# Patient Record
Sex: Male | Born: 1980 | Race: Black or African American | Hispanic: No | Smoking: Never smoker
Health system: Southern US, Community
[De-identification: ages and names within clinical notes are randomized; demographics above are authoritative.]

## PROBLEM LIST (undated history)

## (undated) DIAGNOSIS — I213 ST elevation (STEMI) myocardial infarction of unspecified site: Secondary | ICD-10-CM

## (undated) DIAGNOSIS — I10 Essential (primary) hypertension: Secondary | ICD-10-CM

## (undated) DIAGNOSIS — E785 Hyperlipidemia, unspecified: Secondary | ICD-10-CM

## (undated) DIAGNOSIS — R7303 Prediabetes: Secondary | ICD-10-CM

## (undated) DIAGNOSIS — E782 Mixed hyperlipidemia: Secondary | ICD-10-CM

## (undated) DIAGNOSIS — I2510 Atherosclerotic heart disease of native coronary artery without angina pectoris: Principal | ICD-10-CM

---

## 2019-12-01 ENCOUNTER — Encounter (HOSPITAL_COMMUNITY)
Admission: EM | Disposition: A | Discharge status: Home / Self Care | Payer: Self-pay | Source: Home / Self Care | Attending: Interventional Cardiology

## 2019-12-01 ENCOUNTER — Other Ambulatory Visit: Payer: Self-pay

## 2019-12-01 ENCOUNTER — Emergency Department (HOSPITAL_BASED_OUTPATIENT_CLINIC_OR_DEPARTMENT_OTHER): Payer: Self-pay

## 2019-12-01 ENCOUNTER — Encounter (HOSPITAL_BASED_OUTPATIENT_CLINIC_OR_DEPARTMENT_OTHER): Payer: Self-pay | Admitting: *Deleted

## 2019-12-01 ENCOUNTER — Inpatient Hospital Stay (HOSPITAL_BASED_OUTPATIENT_CLINIC_OR_DEPARTMENT_OTHER)
Admission: EM | Admit: 2019-12-01 | Discharge: 2019-12-04 | DRG: 246 | Disposition: A | Discharge status: Home / Self Care | Payer: Self-pay | Attending: Interventional Cardiology | Admitting: Interventional Cardiology

## 2019-12-01 DIAGNOSIS — Z20822 Contact with and (suspected) exposure to covid-19: Secondary | ICD-10-CM | POA: Diagnosis present

## 2019-12-01 DIAGNOSIS — E785 Hyperlipidemia, unspecified: Secondary | ICD-10-CM

## 2019-12-01 DIAGNOSIS — E669 Obesity, unspecified: Secondary | ICD-10-CM | POA: Diagnosis present

## 2019-12-01 DIAGNOSIS — Z6830 Body mass index (BMI) 30.0-30.9, adult: Secondary | ICD-10-CM

## 2019-12-01 DIAGNOSIS — Z955 Presence of coronary angioplasty implant and graft: Secondary | ICD-10-CM

## 2019-12-01 DIAGNOSIS — I110 Hypertensive heart disease with heart failure: Secondary | ICD-10-CM | POA: Diagnosis present

## 2019-12-01 DIAGNOSIS — I2111 ST elevation (STEMI) myocardial infarction involving right coronary artery: Principal | ICD-10-CM | POA: Diagnosis present

## 2019-12-01 DIAGNOSIS — I10 Essential (primary) hypertension: Secondary | ICD-10-CM

## 2019-12-01 DIAGNOSIS — I5043 Acute on chronic combined systolic (congestive) and diastolic (congestive) heart failure: Secondary | ICD-10-CM | POA: Diagnosis present

## 2019-12-01 DIAGNOSIS — I219 Acute myocardial infarction, unspecified: Secondary | ICD-10-CM | POA: Diagnosis present

## 2019-12-01 DIAGNOSIS — I213 ST elevation (STEMI) myocardial infarction of unspecified site: Secondary | ICD-10-CM

## 2019-12-01 DIAGNOSIS — E119 Type 2 diabetes mellitus without complications: Secondary | ICD-10-CM | POA: Diagnosis present

## 2019-12-01 DIAGNOSIS — I2510 Atherosclerotic heart disease of native coronary artery without angina pectoris: Secondary | ICD-10-CM | POA: Diagnosis present

## 2019-12-01 HISTORY — DX: Hyperlipidemia, unspecified: E78.5

## 2019-12-01 HISTORY — PX: LEFT HEART CATH AND CORONARY ANGIOGRAPHY: CATH118249

## 2019-12-01 HISTORY — PX: CORONARY/GRAFT ACUTE MI REVASCULARIZATION: CATH118305

## 2019-12-01 HISTORY — DX: ST elevation (STEMI) myocardial infarction of unspecified site: I21.3

## 2019-12-01 HISTORY — PX: CORONARY BALLOON ANGIOPLASTY: CATH118233

## 2019-12-01 HISTORY — DX: Prediabetes: R73.03

## 2019-12-01 HISTORY — DX: Essential (primary) hypertension: I10

## 2019-12-01 LAB — CBC WITH DIFFERENTIAL/PLATELET
Abs Immature Granulocytes: 0.02 10*3/uL (ref 0.00–0.07)
Basophils Absolute: 0 10*3/uL (ref 0.0–0.1)
Basophils Relative: 0 %
Eosinophils Absolute: 0 10*3/uL (ref 0.0–0.5)
Eosinophils Relative: 0 %
HCT: 45 % (ref 39.0–52.0)
Hemoglobin: 15.5 g/dL (ref 13.0–17.0)
Immature Granulocytes: 0 %
Lymphocytes Relative: 19 %
Lymphs Abs: 1.5 10*3/uL (ref 0.7–4.0)
MCH: 30.8 pg (ref 26.0–34.0)
MCHC: 34.4 g/dL (ref 30.0–36.0)
MCV: 89.5 fL (ref 80.0–100.0)
Monocytes Absolute: 0.5 10*3/uL (ref 0.1–1.0)
Monocytes Relative: 6 %
Neutro Abs: 5.6 10*3/uL (ref 1.7–7.7)
Neutrophils Relative %: 75 %
Platelets: 323 10*3/uL (ref 150–400)
RBC: 5.03 MIL/uL (ref 4.22–5.81)
RDW: 13.2 % (ref 11.5–15.5)
WBC: 7.6 10*3/uL (ref 4.0–10.5)
nRBC: 0 % (ref 0.0–0.2)

## 2019-12-01 LAB — LIPASE, BLOOD: Lipase: 24 U/L (ref 11–51)

## 2019-12-01 LAB — COMPREHENSIVE METABOLIC PANEL
ALT: 46 U/L — ABNORMAL HIGH (ref 0–44)
AST: 68 U/L — ABNORMAL HIGH (ref 15–41)
Albumin: 4.6 g/dL (ref 3.5–5.0)
Alkaline Phosphatase: 49 U/L (ref 38–126)
Anion gap: 11 (ref 5–15)
BUN: 12 mg/dL (ref 6–20)
CO2: 25 mmol/L (ref 22–32)
Calcium: 9.7 mg/dL (ref 8.9–10.3)
Chloride: 99 mmol/L (ref 98–111)
Creatinine, Ser: 1.18 mg/dL (ref 0.61–1.24)
GFR, Estimated: >60 mL/min (ref >60)
Glucose, Bld: 141 mg/dL — ABNORMAL HIGH (ref 70–99)
Potassium: 4 mmol/L (ref 3.5–5.1)
Sodium: 135 mmol/L (ref 135–145)
Total Bilirubin: 0.5 mg/dL (ref 0.3–1.2)
Total Protein: 8.3 g/dL — ABNORMAL HIGH (ref 6.5–8.1)

## 2019-12-01 LAB — RESPIRATORY PANEL BY RT PCR (FLU A&B, COVID)
Influenza A by PCR: NEGATIVE
Influenza B by PCR: NEGATIVE
SARS Coronavirus 2 by RT PCR: NEGATIVE

## 2019-12-01 LAB — TROPONIN I (HIGH SENSITIVITY): Troponin I (High Sensitivity): 2168 ng/L (ref <18)

## 2019-12-01 LAB — D-DIMER, QUANTITATIVE: D-Dimer, Quant: 0.79 ug/mL-FEU — ABNORMAL HIGH (ref 0.00–0.50)

## 2019-12-01 SURGERY — CORONARY/GRAFT ACUTE MI REVASCULARIZATION
Anesthesia: LOCAL

## 2019-12-01 MED ORDER — LIDOCAINE HCL (PF) 1 % IJ SOLN
INTRAMUSCULAR | Status: AC
  Filled 2019-12-01: qty 30

## 2019-12-01 MED ORDER — ONDANSETRON HCL 4 MG/2ML IJ SOLN: 4.0000 mg | Freq: Four times a day (QID) | INTRAMUSCULAR | Status: DC | PRN

## 2019-12-01 MED ORDER — VERAPAMIL HCL 2.5 MG/ML IV SOLN
INTRAVENOUS | Status: AC
  Filled 2019-12-01: qty 2

## 2019-12-01 MED ORDER — NITROGLYCERIN IN D5W 200-5 MCG/ML-% IV SOLN
INTRAVENOUS | Status: AC | PRN
  Administered 2019-12-01: 20 ug/min via INTRAVENOUS

## 2019-12-01 MED ORDER — AMLODIPINE BESYLATE 5 MG PO TABS
5.0000 mg | ORAL_TABLET | Freq: Every day | ORAL | Status: DC
  Administered 2019-12-01 – 2019-12-04 (×3): 5 mg via ORAL
  Filled 2019-12-01 (×4): qty 1

## 2019-12-01 MED ORDER — TIROFIBAN HCL IN NACL 5-0.9 MG/100ML-% IV SOLN
INTRAVENOUS | Status: AC
  Filled 2019-12-01: qty 100

## 2019-12-01 MED ORDER — HEPARIN (PORCINE) 25000 UT/250ML-% IV SOLN
1000.0000 [IU]/h | INTRAVENOUS | Status: DC
  Filled 2019-12-01: qty 250

## 2019-12-01 MED ORDER — TIROFIBAN HCL IN NACL 5-0.9 MG/100ML-% IV SOLN
0.1500 ug/kg/min | INTRAVENOUS | Status: DC
  Administered 2019-12-01 – 2019-12-03 (×7): 0.15 ug/kg/min via INTRAVENOUS
  Filled 2019-12-01: qty 100
  Filled 2019-12-01: qty 200
  Filled 2019-12-01 (×3): qty 100

## 2019-12-01 MED ORDER — IOHEXOL 350 MG/ML SOLN
INTRAVENOUS | Status: AC
  Filled 2019-12-01: qty 1

## 2019-12-01 MED ORDER — IOHEXOL 350 MG/ML SOLN
INTRAVENOUS | Status: DC | PRN
  Administered 2019-12-01: 180 mL via INTRA_ARTERIAL

## 2019-12-01 MED ORDER — CLOPIDOGREL BISULFATE 75 MG PO TABS
75.0000 mg | ORAL_TABLET | Freq: Every day | ORAL | Status: DC
  Filled 2019-12-01: qty 1

## 2019-12-01 MED ORDER — FENTANYL CITRATE (PF) 100 MCG/2ML IJ SOLN: 12.5000 ug | INTRAMUSCULAR | Status: DC | PRN

## 2019-12-01 MED ORDER — FENTANYL CITRATE (PF) 100 MCG/2ML IJ SOLN: 50.0000 ug | Freq: Once | INTRAMUSCULAR | Status: AC

## 2019-12-01 MED ORDER — TIROFIBAN (AGGRASTAT) BOLUS VIA INFUSION
INTRAVENOUS | Status: DC | PRN
  Administered 2019-12-01: 2682.5 ug via INTRAVENOUS

## 2019-12-01 MED ORDER — HEPARIN (PORCINE) IN NACL 1000-0.9 UT/500ML-% IV SOLN
INTRAVENOUS | Status: AC
  Filled 2019-12-01: qty 500

## 2019-12-01 MED ORDER — CLOPIDOGREL BISULFATE 300 MG PO TABS
ORAL_TABLET | ORAL | Status: DC | PRN
  Administered 2019-12-01: 600 mg via ORAL

## 2019-12-01 MED ORDER — CLOPIDOGREL BISULFATE 300 MG PO TABS
ORAL_TABLET | ORAL | Status: AC
  Filled 2019-12-01: qty 1

## 2019-12-01 MED ORDER — LIDOCAINE VISCOUS HCL 2 % MT SOLN
15.0000 mL | Freq: Once | OROMUCOSAL | Status: AC
  Administered 2019-12-01: 15 mL via ORAL
  Filled 2019-12-01: qty 15

## 2019-12-01 MED ORDER — ALUM & MAG HYDROXIDE-SIMETH 200-200-20 MG/5ML PO SUSP
30.0000 mL | Freq: Once | ORAL | Status: AC
  Administered 2019-12-01: 30 mL via ORAL
  Filled 2019-12-01: qty 30

## 2019-12-01 MED ORDER — HEPARIN (PORCINE) IN NACL 1000-0.9 UT/500ML-% IV SOLN
INTRAVENOUS | Status: DC | PRN
  Administered 2019-12-01 (×2): 500 mL

## 2019-12-01 MED ORDER — MIDAZOLAM HCL 2 MG/2ML IJ SOLN
INTRAMUSCULAR | Status: AC
  Filled 2019-12-01: qty 2

## 2019-12-01 MED ORDER — ACETAMINOPHEN 325 MG PO TABS: 650.0000 mg | ORAL_TABLET | ORAL | Status: DC | PRN

## 2019-12-01 MED ORDER — FENTANYL CITRATE (PF) 100 MCG/2ML IJ SOLN
INTRAMUSCULAR | Status: DC | PRN
  Administered 2019-12-01: 25 ug via INTRAVENOUS

## 2019-12-01 MED ORDER — SODIUM CHLORIDE 0.9 % IV SOLN
INTRAVENOUS | Status: AC | PRN
  Administered 2019-12-01: 100 mL/h via INTRAVENOUS

## 2019-12-01 MED ORDER — FENTANYL CITRATE (PF) 100 MCG/2ML IJ SOLN
INTRAMUSCULAR | Status: AC
  Administered 2019-12-01: 50 ug via INTRAVENOUS
  Filled 2019-12-01: qty 2

## 2019-12-01 MED ORDER — MIDAZOLAM HCL 2 MG/2ML IJ SOLN
INTRAMUSCULAR | Status: DC | PRN
  Administered 2019-12-01: 1 mg via INTRAVENOUS

## 2019-12-01 MED ORDER — HEPARIN SODIUM (PORCINE) 1000 UNIT/ML IJ SOLN
INTRAMUSCULAR | Status: DC | PRN
  Administered 2019-12-01: 5000 [IU] via INTRAVENOUS
  Administered 2019-12-01: 3000 [IU] via INTRAVENOUS

## 2019-12-01 MED ORDER — FENTANYL CITRATE (PF) 100 MCG/2ML IJ SOLN
INTRAMUSCULAR | Status: AC
  Filled 2019-12-01: qty 2

## 2019-12-01 MED ORDER — VERAPAMIL HCL 2.5 MG/ML IV SOLN
INTRAVENOUS | Status: DC | PRN
  Administered 2019-12-01: 10 mL via INTRA_ARTERIAL

## 2019-12-01 MED ORDER — HEPARIN BOLUS VIA INFUSION
4000.0000 [IU] | Freq: Once | INTRAVENOUS | Status: AC
  Administered 2019-12-01: 4000 [IU] via INTRAVENOUS

## 2019-12-01 MED ORDER — HEPARIN (PORCINE) 25000 UT/250ML-% IV SOLN
1500.0000 [IU]/h | INTRAVENOUS | Status: DC
  Administered 2019-12-02: 1000 [IU]/h via INTRAVENOUS
  Administered 2019-12-03: 1500 [IU]/h via INTRAVENOUS
  Filled 2019-12-01 (×2): qty 250

## 2019-12-01 MED ORDER — TIROFIBAN HCL IN NACL 5-0.9 MG/100ML-% IV SOLN
INTRAVENOUS | Status: AC | PRN
  Administered 2019-12-01: 0.15 ug/kg/min via INTRAVENOUS

## 2019-12-01 MED ORDER — HEPARIN SODIUM (PORCINE) 1000 UNIT/ML IJ SOLN
INTRAMUSCULAR | Status: AC
  Filled 2019-12-01: qty 1

## 2019-12-01 MED ORDER — ASPIRIN EC 81 MG PO TBEC: 81.0000 mg | DELAYED_RELEASE_TABLET | Freq: Every day | ORAL | Status: DC

## 2019-12-01 MED ORDER — LIDOCAINE HCL (PF) 1 % IJ SOLN
INTRAMUSCULAR | Status: DC | PRN
  Administered 2019-12-01: 5 mL via SUBCUTANEOUS

## 2019-12-01 MED ORDER — ATORVASTATIN CALCIUM 80 MG PO TABS: 80.0000 mg | ORAL_TABLET | Freq: Every day | ORAL | Status: DC

## 2019-12-01 MED ORDER — ASPIRIN 81 MG PO CHEW
CHEWABLE_TABLET | ORAL | Status: AC
  Administered 2019-12-01: 324 mg
  Filled 2019-12-01: qty 4

## 2019-12-01 MED ORDER — NITROGLYCERIN 1 MG/10 ML FOR IR/CATH LAB
INTRA_ARTERIAL | Status: DC | PRN
  Administered 2019-12-01: 200 ug via INTRACORONARY

## 2019-12-01 MED ORDER — NITROGLYCERIN IN D5W 200-5 MCG/ML-% IV SOLN
INTRAVENOUS | Status: AC
  Filled 2019-12-01: qty 250

## 2019-12-01 MED ORDER — HEPARIN (PORCINE) 25000 UT/250ML-% IV SOLN
1250.0000 [IU]/h | INTRAVENOUS | Status: DC
  Administered 2019-12-01: 1250 [IU]/h via INTRAVENOUS
  Filled 2019-12-01: qty 250

## 2019-12-01 MED ORDER — NITROGLYCERIN 0.4 MG SL SUBL: 0.4000 mg | SUBLINGUAL_TABLET | SUBLINGUAL | Status: DC | PRN

## 2019-12-01 SURGICAL SUPPLY — 16 items
BALLN SAPPHIRE 2.0X12 (BALLOONS) ×2
BALLOON SAPPHIRE 2.0X12 (BALLOONS) ×1 IMPLANT
CATH 5FR JL3.5 JR4 ANG PIG MP (CATHETERS) ×2 IMPLANT
CATH LAUNCHER 5F EBU3.5 (CATHETERS) ×2 IMPLANT
CATH VISTA GUIDE 6FR JR4 (CATHETERS) ×2 IMPLANT
DEVICE RAD COMP TR BAND LRG (VASCULAR PRODUCTS) ×2 IMPLANT
ELECT DEFIB PAD ADLT CADENCE (PAD) ×2 IMPLANT
GLIDESHEATH SLEND A-KIT 6F 22G (SHEATH) ×2 IMPLANT
GUIDEWIRE INQWIRE 1.5J.035X260 (WIRE) ×1 IMPLANT
INQWIRE 1.5J .035X260CM (WIRE) ×2
KIT HEART LEFT (KITS) ×2 IMPLANT
PACK CARDIAC CATHETERIZATION (CUSTOM PROCEDURE TRAY) ×2 IMPLANT
TRANSDUCER W/STOPCOCK (MISCELLANEOUS) ×2 IMPLANT
TUBING CIL FLEX 10 FLL-RA (TUBING) ×2 IMPLANT
WIRE ASAHI PROWATER 180CM (WIRE) ×2 IMPLANT
WIRE COUGAR XT STRL 190CM (WIRE) ×2 IMPLANT

## 2019-12-01 NOTE — Progress Notes (Signed)
ANTICOAGULATION CONSULT NOTE  Pharmacy Consult for Heparin Indication: chest pain/ACS  No Known Allergies  Patient Measurements: Height: 6\' 2"  (188 cm) Weight: 107.3 kg (236 lb 8.9 oz) IBW/kg (Calculated) : 82.2 Heparin Dosing Weight: 104 kg  Vital Signs: Temp: 97.9 F (36.6 C) (11/15 1944) Temp Source: Oral (11/15 1944) BP: 160/96 (11/15 2100) Pulse Rate: 50 (11/15 2100)  Labs: Recent Labs    12/01/19 2007  HGB 15.5  HCT 45.0  PLT 323  CREATININE 1.18  TROPONINIHS 2,168*    Estimated Creatinine Clearance: 111.8 mL/min (by C-G formula based on SCr of 1.18 mg/dL).   Medical History: History reviewed. No pertinent past medical history.  Medications:  Scheduled:  . heparin  4,000 Units Intravenous Once    Assessment: Patient is a 39 yom that presents to the ED with c/o CP. The patient was found to have a elevated trop of 2168.2169 Patient has been activated as a STEMI and is being transferred to [facility].  Will need to f/u drip plans post cath.   Goal of Therapy:  Heparin level 0.3-0.7 units/ml Monitor platelets by anticoagulation protocol: Yes   Plan:  - Heparin bolus 4000 units IV x 1 dose - Heparin drip @ 1250 units/hr - Heparin level in ~ 6 hours  - Monitor patient for s/s of bleeding and CBC while on heparin   [facility] PharmD. BCPS  12/01/2019,9:17 PM

## 2019-12-01 NOTE — ED Notes (Signed)
Date and time results received: 12/01/19 2110  Test: troponin Critical Value: 2168  Name of Provider Notified: Dr. Lockie Mola  Orders Received? Or Actions Taken?: heparin, stemi protocol

## 2019-12-01 NOTE — ED Notes (Signed)
Report given to [facility]. 

## 2019-12-01 NOTE — ED Notes (Signed)
Pt departs ED with [facility] at this time.  

## 2019-12-01 NOTE — H&P (Addendum)
The patient has been seen in conjunction with Alric Quan, MD. All aspects of care have been considered and discussed. The patient has been personally interviewed, examined, and all clinical data has been reviewed.   We met the patient in the ambulance bay.  He detail history and ultimately physical examination was obtained on the trip from the emergency room to completion of transfer to the Cath Lab table (physical exam).  EKG with clear evidence of inferior STEMI and development of inferior Q waves.  Severely hypertensive on presentation with ongoing 7 out of 10 chest discomfort.  Carotid, bilateral radial, bilateral femoral, and bilateral posterior tibial pulses were felt.  Coronary angiography, emergency percutaneous mechanical revascularization, and potential for stent implantation were discussed in detail with the patient and accepted.  Critical care time 31 minutes  Cardiology Admission History and Physical:   Patient ID: Tyrone Carter MRN: [id number]; DOB: Aug 03, 1980   Admission date: 12/01/2019  Primary Care Provider: Patient, No Pcp Per [place] Cardiologist: No primary care provider on file.  [facility] Electrophysiologist:  None   Chief Complaint:  Chest pain  Patient Profile:   Tyrone Carter is a 39 y.o. male with no significant past medical history who presents with chest pain.  History of Present Illness:   Tyrone Carter was in his usual state of health until he was making lunch around noon and developed severe, sudden onset 10/10 chest pain. He laid down to rest with some improvement in his pain to ~5/10. However, his pain later worsened again to a 10/10 pressure like pain prompting him to go to the ER for evaluation. At the OSH, he was found to have inferior ST elevation on ECG with a significantly elevated troponin. The STEMI line was activated and he was transferred to [facility].  On arrival to [facility], he continued to have ongoing 5/10 pressure  like chest pain in the center of his chest. He denied any shortness of breath, nausea, palpitations, lightheadedness, dizziness, orthopnea, PND, or lower leg swelling.   History reviewed. No pertinent past medical history.  History reviewed. No pertinent surgical history.   Medications Prior to Admission: None  Allergies:   No Known Allergies  Social History:   Works as an Chief Executive Officer. No alcohol, drug or tobacco use.   Family History:   No family history of CAD, CHF, or SCD.    ROS:  Please see the history of present illness.  All other ROS reviewed and negative.     Physical Exam/Data:   Vitals:   12/01/19 2030 12/01/19 2100 12/01/19 2127 12/01/19 2130  BP: (!) 163/98 (!) 160/96 (!) 182/108 (!) 160/103  Pulse: (!) 47 (!) 50 66 65  Resp: 17 13 14 17   Temp:      TempSrc:      SpO2: 98% 99% 99% 99%  Weight:      Height:       No intake or output data in the 24 hours ending 12/01/19 2207 Last 3 Weights 12/01/2019  Weight (lbs) 236 lb 8.9 oz  Weight (kg) 107.3 kg     Body mass index is 30.37 kg/m.  General:  Well nourished, well developed, in no acute distress HEENT: normal Neck: no JVD Cardiac:  normal S1, S2; RRR; no murmurs, rubs or gallops Lungs:  clear to auscultation bilaterally, no wheezing, rhonchi or rales  Abd: soft, nontender, no hepatomegaly  Ext: no edema Skin: warm and dry  Neuro:  CNs 2-12 intact, no focal abnormalities noted  Psych:  Normal affect    EKG:  The ECG that was done was personally reviewed and demonstrates sinus rhythm with HR 53bpm. ST elevation inferiorly in leads II, III and aVF with Q-waves  Relevant CV Studies: None  Laboratory Data:  High Sensitivity Troponin:   Recent Labs  Lab 12/01/19 2007  TROPONINIHS 2,168*      Chemistry Recent Labs  Lab 12/01/19 2007  NA 135  K 4.0  CL 99  CO2 25  GLUCOSE 141*  BUN 12  CREATININE 1.18  CALCIUM 9.7  GFRNONAA >60  ANIONGAP 11    Recent Labs  Lab  12/01/19 2007  PROT 8.3*  ALBUMIN 4.6  AST 68*  ALT 46*  ALKPHOS 49  BILITOT 0.5   Hematology Recent Labs  Lab 12/01/19 2007  WBC 7.6  RBC 5.03  HGB 15.5  HCT 45.0  MCV 89.5  MCH 30.8  MCHC 34.4  RDW 13.2  PLT 323   BNPNo results for input(s): BNP, PROBNP in the last 168 hours.  DDimer  Recent Labs  Lab 12/01/19 2105  DDIMER 0.79*     Radiology/Studies:  DG Chest Portable 1 View  Result Date: 12/01/2019 CLINICAL DATA:  39 year old male with chest pain. EXAM: PORTABLE CHEST 1 VIEW COMPARISON:  None. FINDINGS: The heart size and mediastinal contours are within normal limits. Both lungs are clear. The visualized skeletal structures are unremarkable. IMPRESSION: No active disease. Electronically Signed   By: Anner Crete M.D.   On: 12/01/2019 20:08     Assessment and Plan:   1. STEMI- No known risk factors. No family history. Cath showed thrombus in RCA with distally occluded vessel with poor flow despite angioplasty. Plan for 24-36 hours of aggrastat followed by repeat cath. - S/p aspirin 360m  - Continue aspirin 86mdaily - Continue aggrastat infusion x 36 hours - Restart heparin drip without bolus in 6 hours - S/p plavix 60030m- Continue plavix 72m37mily - Start nitro infusion to help with pain - Start lipitor 80mg50mheck lipid panel and HgA1c  - Echo in AM - Start amlodipine 5mg t26melp with microcirculation and blood pressure    TIMI Risk Score for ST  Elevation MI:   The patient's TIMI risk score is 0, which indicates a 0.8% risk of all cause mortality at 30 days.   Severity of Illness: The appropriate patient status for this patient is INPATIENT. Inpatient status is judged to be reasonable and necessary in order to provide the required intensity of service to ensure the patient's safety. The patient's presenting symptoms, physical exam findings, and initial radiographic and laboratory data in the context of their chronic comorbidities is felt  to place them at high risk for further clinical deterioration. Furthermore, it is not anticipated that the patient will be medically stable for discharge from the hospital within 2 midnights of admission. The following factors support the patient status of inpatient.   " The patient's presenting symptoms include chest pain. " The worrisome physical exam findings include ongoing chest pain. " The initial radiographic and laboratory data are worrisome because of elevated troponin. " The chronic co-morbidities include none.   * I certify that at the point of admission it is my clinical judgment that the patient will require inpatient hospital care spanning beyond 2 midnights from the point of admission due to high intensity of service, high risk for further deterioration and high frequency of surveillance required.*    For questions or updates,  please contact [place] Please consult [email or web address] for contact info under     Signed, Princella Pellegrini, MD  12/01/2019 10:07 PM

## 2019-12-01 NOTE — ED Notes (Addendum)
Attempted to call report to number listed for cath lab 1 and cath lab 2 with no success x4.

## 2019-12-01 NOTE — Progress Notes (Signed)
ANTICOAGULATION CONSULT NOTE  Pharmacy Consult for Heparin Indication: chest pain/ACS  No Known Allergies  Patient Measurements: Height: 6\' 2"  (188 cm) Weight: 107.3 kg (236 lb 8.9 oz) IBW/kg (Calculated) : 82.2 Heparin Dosing Weight: 104 kg  Vital Signs: Temp: 97.9 F (36.6 C) (11/15 1944) Temp Source: Oral (11/15 1944) BP: 159/108 (11/15 2306) Pulse Rate: 52 (11/15 2306)  Labs: Recent Labs    12/01/19 2007  HGB 15.5  HCT 45.0  PLT 323  CREATININE 1.18  TROPONINIHS 2,168*    Estimated Creatinine Clearance: 111.8 mL/min (by C-G formula based on SCr of 1.18 mg/dL).   Medical History: History reviewed. No pertinent past medical history.   Assessment: 39 yom presenting with CP and started on heparin at [facility]. Code STEMI called and taken emergently to cath lab, found to have thrombus in RCA and distally occluded vessels - unable to stent. Plan per Cardiology is to continue tirofiban and resume heparin 6 hrs post-sheath removal with plans to return to cath lab in the next day or two. Sheath removed at 2306. Patient is not on anticoagulation PTA. CBC wnl. No active bleed issues reported.  Goal of Therapy:  Heparin level 0.3-0.5 units/ml Monitor platelets by anticoagulation protocol: Yes   Plan:  No bolus. Resume heparin at 1000 units/hr at 11/16 at 0500 (6 hrs post-sheath removal) Check 6hr heparin level Continue tirofiban 0.15 mcg/kg/min x 18 hrs per Cardiology Monitor daily heparin level and CBC, s/sx bleeding   12/16, PharmD, BCPS Please check AMION for all [facility] Pharmacy contact numbers Clinical Pharmacist 12/01/2019 11:11 PM

## 2019-12-01 NOTE — ED Provider Notes (Addendum)
[facility] EMERGENCY DEPARTMENT Provider Note   CSN: [id number] Arrival date & time: 12/01/19  1940     History Chief Complaint  Patient presents with  . Chest Pain    Tyrone Carter is a 39 y.o. male.  The history is provided by the patient.  Chest Pain Pain location:  Substernal area Pain quality: aching and burning   Pain radiates to:  Does not radiate Pain severity:  Mild Onset quality:  Gradual Timing:  Constant Progression:  Improving Chronicity:  New Context: not eating and not intercourse   Relieved by:  Nothing Worsened by:  Nothing Associated symptoms: no abdominal pain, no back pain, no cough, no fever, no palpitations, no shortness of breath and no vomiting   Risk factors: no coronary artery disease, no diabetes mellitus, no high cholesterol, no hypertension, no prior DVT/PE and no smoking        History reviewed. No pertinent past medical history.  There are no problems to display for this patient.   History reviewed. No pertinent surgical history.     No family history on file.  Social History   Tobacco Use  . Smoking status: Never Smoker  . Smokeless tobacco: Never Used  Substance Use Topics  . Alcohol use: Yes  . Drug use: Never    Home Medications Prior to Admission medications   Not on File    Allergies    Patient has no known allergies.  Review of Systems   Review of Systems  Constitutional: Negative for chills and fever.  HENT: Negative for ear pain and sore throat.   Eyes: Negative for pain and visual disturbance.  Respiratory: Negative for cough and shortness of breath.   Cardiovascular: Positive for chest pain. Negative for palpitations.  Gastrointestinal: Negative for abdominal pain and vomiting.  Genitourinary: Negative for dysuria and hematuria.  Musculoskeletal: Negative for arthralgias and back pain.  Skin: Negative for color change and rash.  Neurological: Negative for seizures and syncope.  All  other systems reviewed and are negative.   Physical Exam Updated Vital Signs  ED Triage Vitals  Enc Vitals Group     BP 12/01/19 1944 (!) 181/110     Pulse Rate 12/01/19 1944 (!) 58     Resp 12/01/19 1948 14     Temp 12/01/19 1944 97.9 F (36.6 C)     Temp Source 12/01/19 1944 Oral     SpO2 12/01/19 1944 99 %     Weight 12/01/19 1943 236 lb 8.9 oz (107.3 kg)     Height 12/01/19 1943 6\' 2"  (1.88 m)     Head Circumference --      Peak Flow --      Pain Score 12/01/19 1948 9     Pain Loc --      Pain Edu? --      Excl. in GC? --     Physical Exam Vitals and nursing note reviewed.  Constitutional:      General: He is not in acute distress.    Appearance: He is well-developed. He is not ill-appearing.  HENT:     Head: Normocephalic and atraumatic.  Eyes:     Extraocular Movements: Extraocular movements intact.     Conjunctiva/sclera: Conjunctivae normal.     Pupils: Pupils are equal, round, and reactive to light.  Cardiovascular:     Rate and Rhythm: Normal rate and regular rhythm.     Pulses:          Radial pulses  are 2+ on the right side and 2+ on the left side.     Heart sounds: Normal heart sounds. No murmur heard.   Pulmonary:     Effort: Pulmonary effort is normal. No respiratory distress.     Breath sounds: Normal breath sounds. No decreased breath sounds, wheezing or rhonchi.  Abdominal:     Palpations: Abdomen is soft.     Tenderness: There is no abdominal tenderness.  Musculoskeletal:        General: Normal range of motion.     Cervical back: Normal range of motion and neck supple.     Right lower leg: No edema.     Left lower leg: No edema.  Skin:    General: Skin is warm and dry.     Capillary Refill: Capillary refill takes less than 2 seconds.  Neurological:     General: No focal deficit present.     Mental Status: He is alert.  Psychiatric:        Mood and Affect: Mood normal.     ED Results / Procedures / Treatments   Labs (all labs ordered  are listed, but only abnormal results are displayed) Labs Reviewed  COMPREHENSIVE METABOLIC PANEL - Abnormal; Notable for the following components:      Result Value   Glucose, Bld 141 (*)    Total Protein 8.3 (*)    AST 68 (*)    ALT 46 (*)    All other components within normal limits  D-DIMER, QUANTITATIVE (NOT AT [facility]) - Abnormal; Notable for the following components:   D-Dimer, Quant 0.79 (*)    All other components within normal limits  TROPONIN I (HIGH SENSITIVITY) - Abnormal; Notable for the following components:   Troponin I (High Sensitivity) 2,168 (*)    All other components within normal limits  RESPIRATORY PANEL BY RT PCR (FLU A&B, COVID)  CBC WITH DIFFERENTIAL/PLATELET  LIPASE, BLOOD  RAPID URINE DRUG SCREEN, HOSP PERFORMED  HEPARIN LEVEL (UNFRACTIONATED)    EKG EKG Interpretation  Date/Time:  Monday December 01 2019 19:50:17 EST Ventricular Rate:  53 PR Interval:    QRS Duration: 105 QT Interval:  425 QTC Calculation: 399 R Axis:   54 Text Interpretation: Sinus rhythm Inferior infarct, acute (RCA) Probable RV involvement, suggest recording right precordial leads >>> Acute MI <<< EKG was collected at 2050 Confirmed by Virgina Norfolk [phone]) on 12/01/2019 9:24:52 PM   Radiology DG Chest Portable 1 View  Result Date: 12/01/2019 CLINICAL DATA:  40 year old male with chest pain. EXAM: PORTABLE CHEST 1 VIEW COMPARISON:  None. FINDINGS: The heart size and mediastinal contours are within normal limits. Both lungs are clear. The visualized skeletal structures are unremarkable. IMPRESSION: No active disease. Electronically Signed   By: Elgie Collard M.D.   On: 12/01/2019 20:08    Procedures .Critical Care Performed by: Virgina Norfolk, DO Authorized by: Virgina Norfolk, DO   Critical care provider statement:    Critical care time (minutes):  45   Critical care was necessary to treat or prevent imminent or life-threatening deterioration of the following  conditions:  Cardiac failure   Critical care was time spent personally by me on the following activities:  Blood draw for specimens, development of treatment plan with patient or surrogate, discussions with primary provider, evaluation of patient's response to treatment, examination of patient, obtaining history from patient or surrogate, ordering and performing treatments and interventions, ordering and review of laboratory studies, ordering and review of radiographic studies, pulse oximetry  and re-evaluation of patient's condition   I assumed direction of critical care for this patient from another provider in my specialty: no     (including critical care time)  Medications Ordered in ED Medications  heparin ADULT infusion 100 units/mL (25000 units/286mL sodium chloride 0.45%) (1,250 Units/hr Intravenous New Bag/Given 12/01/19 2125)  alum & mag hydroxide-simeth (MAALOX/MYLANTA) 200-200-20 MG/5ML suspension 30 mL (30 mLs Oral Given 12/01/19 2012)    And  lidocaine (XYLOCAINE) 2 % viscous mouth solution 15 mL (15 mLs Oral Given 12/01/19 2012)  aspirin 81 MG chewable tablet (324 mg  Given 12/01/19 2105)  heparin bolus via infusion 4,000 Units (4,000 Units Intravenous Bolus from Bag 12/01/19 2125)  fentaNYL (SUBLIMAZE) injection 50 mcg (50 mcg Intravenous Given 12/01/19 2126)    ED Course  I have reviewed the triage vital signs and the nursing notes.  Pertinent labs & imaging results that were available during my care of the patient were reviewed by me and considered in my medical decision making (see chart for details).    MDM Rules/Calculators/A&P                          Dewain Platz is a 39 year old male who presents to the ED with chest pain.  No significant medical history.  Patient arrives hypertensive and bradycardic.  Overall appears uncomfortable.  Says that he has had chest pain since about 12 noon about 7 or 8 hours ago.  Is kind of achy pressure pain in the middle of his  chest.  Not sure if he gets worse with exertion.  Denies any recent illness.  No Covid symptoms.  No blood clot history or risk factors.  Does not believe that it is associated with eating and is not worse with palpation or movement.  No significant cardiac history in the family.  Denies any cough or sputum production.  Lab work including EKG, troponin, chest x-ray has been ordered.  Patient overall appears well.  No concern for dissection as patient with no widened mediastinum on x-ray.  Has equal and strong pulses throughout.  As patient was awaiting work-up patient had been here for about an hour when I realized that EKG had not been performed yet.  At that time EKG was done which showed ST elevations in the inferior leads concerning for an acute MI.  There were some Q waves as well.  Talked with Dr. Katrinka Blazing for cardiology and Cath Lab was activated.  At that time troponin did result at 2168.  Will have patient go to [facility] for emergent heart cath as concern for ST elevation MI.  Did not have any recent illness or sickness, could be a myocarditis.  However given EKG changes need to rule out ischemic process.  At that time patient was given aspirin, heparin bolus and infusion.  Patient given fentanyl for pain and will avoid nitroglycerin as concern for inferior infarct could cause instability.  Hopefully fentanyl will help with blood pressure.  Transferred to [facility] for emergent heart cath.  Hemodynamically stable throughout my care.  This chart was dictated using voice recognition software.  Despite best efforts to proofread,  errors can occur which can change the documentation meaning.    Final Clinical Impression(s) / ED Diagnoses Final diagnoses:  ST elevation myocardial infarction (STEMI), unspecified artery (HCC)    Rx / DC Orders ED Discharge Orders    None       Harlei Lehrmann, DO  12/01/19 2138    Virgina Norfolk, DO 12/01/19 2148

## 2019-12-01 NOTE — ED Notes (Addendum)
Called [facility] spoke Delice Bison will page Cardiology [facility]) Valinda Party

## 2019-12-01 NOTE — ED Notes (Signed)
[facility] arrived at this time. 

## 2019-12-01 NOTE — CV Procedure (Signed)
   Level of thrombus mid RCA with distal emboli in distal RCA continuation at bifurcation with continuation and the first acute marginal branch and also embolus to the distal acute marginal branch.  Unsuccessful angioplasty of the distal RCA/RCA continuation beyond the PDA in the direction of each branch.  Unknown size of branches prevented aggressive PCI.  Left coronary system is large.  Left to right collaterals are noted to the inferolateral RCA branches which are small.  Inferior wall severe hypokinesis.  EF 50%.  LVEDP 25 mmHg.  Plan IV Aggrastat x24 to 36 hours and resume IV heparin in 4 to 6 hours.  Plan restudy with possible rheolytic AngioJet of the mid coronary and if there is improved flow of the distal vessel perhaps consideration of intervention there as well.

## 2019-12-01 NOTE — ED Triage Notes (Signed)
Chest tightness in the center of his chest sudden onset while making lunch. Fatigue.

## 2019-12-02 ENCOUNTER — Inpatient Hospital Stay (HOSPITAL_COMMUNITY): Payer: Self-pay

## 2019-12-02 ENCOUNTER — Encounter (HOSPITAL_COMMUNITY): Payer: Self-pay | Admitting: Interventional Cardiology

## 2019-12-02 DIAGNOSIS — R079 Chest pain, unspecified: Secondary | ICD-10-CM

## 2019-12-02 DIAGNOSIS — I5041 Acute combined systolic (congestive) and diastolic (congestive) heart failure: Secondary | ICD-10-CM

## 2019-12-02 DIAGNOSIS — I10 Essential (primary) hypertension: Secondary | ICD-10-CM

## 2019-12-02 DIAGNOSIS — E7849 Other hyperlipidemia: Secondary | ICD-10-CM

## 2019-12-02 DIAGNOSIS — R7303 Prediabetes: Secondary | ICD-10-CM

## 2019-12-02 LAB — ECHOCARDIOGRAM COMPLETE
Area-P 1/2: 2.29 cm2
Height: 74 in
S' Lateral: 3.2 cm
Weight: 3784.86 oz

## 2019-12-02 LAB — CBC
HCT: 43.5 % (ref 39.0–52.0)
Hemoglobin: 14.9 g/dL (ref 13.0–17.0)
MCH: 31.5 pg (ref 26.0–34.0)
MCHC: 34.3 g/dL (ref 30.0–36.0)
MCV: 92 fL (ref 80.0–100.0)
Platelets: 271 10*3/uL (ref 150–400)
RBC: 4.73 MIL/uL (ref 4.22–5.81)
RDW: 13.1 % (ref 11.5–15.5)
WBC: 8.3 10*3/uL (ref 4.0–10.5)
nRBC: 0 % (ref 0.0–0.2)

## 2019-12-02 LAB — MRSA PCR SCREENING: MRSA by PCR: NEGATIVE

## 2019-12-02 LAB — LIPID PANEL
Cholesterol: 272 mg/dL — ABNORMAL HIGH (ref 0–200)
HDL: 61 mg/dL (ref >40)
LDL Cholesterol: 187 mg/dL — ABNORMAL HIGH (ref 0–99)
Total CHOL/HDL Ratio: 4.5 RATIO
Triglycerides: 121 mg/dL (ref <150)
VLDL: 24 mg/dL (ref 0–40)

## 2019-12-02 LAB — BASIC METABOLIC PANEL
Anion gap: 10 (ref 5–15)
Anion gap: 9 (ref 5–15)
BUN: 10 mg/dL (ref 6–20)
BUN: 7 mg/dL (ref 6–20)
CO2: 23 mmol/L (ref 22–32)
CO2: 24 mmol/L (ref 22–32)
Calcium: 9.1 mg/dL (ref 8.9–10.3)
Calcium: 9 mg/dL (ref 8.9–10.3)
Chloride: 101 mmol/L (ref 98–111)
Chloride: 103 mmol/L (ref 98–111)
Creatinine, Ser: 1.1 mg/dL (ref 0.61–1.24)
Creatinine, Ser: 1.1 mg/dL (ref 0.61–1.24)
GFR, Estimated: >60 mL/min (ref >60)
GFR, Estimated: >60 mL/min (ref >60)
Glucose, Bld: 119 mg/dL — ABNORMAL HIGH (ref 70–99)
Glucose, Bld: 134 mg/dL — ABNORMAL HIGH (ref 70–99)
Potassium: 4 mmol/L (ref 3.5–5.1)
Potassium: 3.9 mmol/L (ref 3.5–5.1)
Sodium: 134 mmol/L — ABNORMAL LOW (ref 135–145)
Sodium: 136 mmol/L (ref 135–145)

## 2019-12-02 LAB — RAPID URINE DRUG SCREEN, HOSP PERFORMED
Amphetamines: POSITIVE — AB
Barbiturates: NOT DETECTED
Benzodiazepines: POSITIVE — AB
Cocaine: NOT DETECTED
Opiates: NOT DETECTED
Tetrahydrocannabinol: NOT DETECTED

## 2019-12-02 LAB — BRAIN NATRIURETIC PEPTIDE: B Natriuretic Peptide: 158.5 pg/mL — ABNORMAL HIGH (ref 0.0–100.0)

## 2019-12-02 LAB — HEPARIN LEVEL (UNFRACTIONATED)
Heparin Unfractionated: <0.1 IU/mL — ABNORMAL LOW (ref 0.30–0.70)
Heparin Unfractionated: 0.14 IU/mL — ABNORMAL LOW (ref 0.30–0.70)

## 2019-12-02 LAB — POCT ACTIVATED CLOTTING TIME
Activated Clotting Time: 257 seconds
Activated Clotting Time: 285 seconds
Activated Clotting Time: 362 seconds

## 2019-12-02 LAB — HEMOGLOBIN A1C
Hgb A1c MFr Bld: 6 % — ABNORMAL HIGH (ref 4.8–5.6)
Mean Plasma Glucose: 125.5 mg/dL

## 2019-12-02 LAB — TROPONIN I (HIGH SENSITIVITY)
Troponin I (High Sensitivity): 14 ng/L (ref <18)
Troponin I (High Sensitivity): 16108 ng/L (ref <18)

## 2019-12-02 MED ORDER — ONDANSETRON HCL 4 MG/2ML IJ SOLN: 4.0000 mg | Freq: Four times a day (QID) | INTRAMUSCULAR | Status: DC | PRN

## 2019-12-02 MED ORDER — ACETAMINOPHEN 325 MG PO TABS: 650.0000 mg | ORAL_TABLET | ORAL | Status: DC | PRN

## 2019-12-02 MED ORDER — SODIUM CHLORIDE 0.9 % IV SOLN: 250.0000 mL | INTRAVENOUS | Status: DC | PRN

## 2019-12-02 MED ORDER — CLOPIDOGREL BISULFATE 75 MG PO TABS
75.0000 mg | ORAL_TABLET | ORAL | Status: AC
  Administered 2019-12-03: 75 mg via ORAL
  Filled 2019-12-02: qty 1

## 2019-12-02 MED ORDER — CLOPIDOGREL BISULFATE 75 MG PO TABS
75.0000 mg | ORAL_TABLET | Freq: Every day | ORAL | Status: DC
  Administered 2019-12-02 – 2019-12-04 (×2): 75 mg via ORAL
  Filled 2019-12-02 (×2): qty 1

## 2019-12-02 MED ORDER — ATORVASTATIN CALCIUM 80 MG PO TABS
80.0000 mg | ORAL_TABLET | Freq: Every day | ORAL | Status: DC
  Administered 2019-12-02 – 2019-12-04 (×2): 80 mg via ORAL
  Filled 2019-12-02 (×2): qty 1

## 2019-12-02 MED ORDER — CHLORHEXIDINE GLUCONATE CLOTH 2 % EX PADS
6.0000 | MEDICATED_PAD | Freq: Every day | CUTANEOUS | Status: DC
  Administered 2019-12-02: 6 via TOPICAL

## 2019-12-02 MED ORDER — CLOPIDOGREL BISULFATE 75 MG PO TABS: 75.0000 mg | ORAL_TABLET | Freq: Every day | ORAL | Status: DC

## 2019-12-02 MED ORDER — SODIUM CHLORIDE 0.9 % IV SOLN: INTRAVENOUS | Status: AC

## 2019-12-02 MED ORDER — SODIUM CHLORIDE 0.9% FLUSH: 3.0000 mL | INTRAVENOUS | Status: DC | PRN

## 2019-12-02 MED ORDER — LABETALOL HCL 5 MG/ML IV SOLN: 10.0000 mg | INTRAVENOUS | Status: AC | PRN

## 2019-12-02 MED ORDER — SODIUM CHLORIDE 0.9% FLUSH
3.0000 mL | Freq: Two times a day (BID) | INTRAVENOUS | Status: DC
  Administered 2019-12-02 – 2019-12-04 (×2): 3 mL via INTRAVENOUS

## 2019-12-02 MED ORDER — SODIUM CHLORIDE 0.9 % WEIGHT BASED INFUSION
3.0000 mL/kg/h | INTRAVENOUS | Status: DC
  Administered 2019-12-03: 3 mL/kg/h via INTRAVENOUS

## 2019-12-02 MED ORDER — SODIUM CHLORIDE 0.9 % WEIGHT BASED INFUSION
1.0000 mL/kg/h | INTRAVENOUS | Status: DC
  Administered 2019-12-03: 1 mL/kg/h via INTRAVENOUS

## 2019-12-02 MED ORDER — INFLUENZA VAC SPLIT QUAD 0.5 ML IM SUSY
0.5000 mL | PREFILLED_SYRINGE | INTRAMUSCULAR | Status: DC
  Filled 2019-12-02: qty 0.5

## 2019-12-02 MED ORDER — NITROGLYCERIN IN D5W 200-5 MCG/ML-% IV SOLN
0.0000 ug/min | INTRAVENOUS | Status: DC
  Administered 2019-12-02: 20 ug/min via INTRAVENOUS

## 2019-12-02 MED ORDER — ASPIRIN 81 MG PO CHEW
81.0000 mg | CHEWABLE_TABLET | Freq: Every day | ORAL | Status: DC
  Administered 2019-12-02 – 2019-12-04 (×2): 81 mg via ORAL
  Filled 2019-12-02 (×2): qty 1

## 2019-12-02 MED ORDER — HYDRALAZINE HCL 20 MG/ML IJ SOLN: 10.0000 mg | INTRAMUSCULAR | Status: AC | PRN

## 2019-12-02 MED ORDER — ASPIRIN 81 MG PO CHEW
81.0000 mg | CHEWABLE_TABLET | ORAL | Status: AC
  Filled 2019-12-02: qty 1

## 2019-12-02 NOTE — Progress Notes (Signed)
   12/01/19 2113  Clinical Encounter Type  Visited With Patient  Visit Type Code  Referral From Nurse  Consult/Referral To Chaplain   Chaplain responded to Level 2 trauma. Patient not available and no support person present. Chaplain let patient's nurse, Robby, know that chaplain remains available as needed.   This note was prepared by Chaplain Resident, Tacy Learn, MDiv. Chaplain remains available as needed through the on-call pager: [phone].

## 2019-12-02 NOTE — Progress Notes (Addendum)
Progress Note  Patient Name: Tyrone Carter Date of Encounter: 12/02/2019  [facility] Cardiologist: Lesleigh Noe, MD   Subjective   Did not get much sleep overnight. Chest discomfort has resolved. Denies dyspnea.  Database identifies multiple risk factors including prediabetes, severe hyperlipidemia (possibly hereditary), and primary hypertension.  Inpatient Medications    Scheduled Meds: . amLODipine  5 mg Oral Daily  . aspirin  81 mg Oral Daily  . atorvastatin  80 mg Oral Daily  . [START ON 12/03/2019] clopidogrel  75 mg Oral Q breakfast  . sodium chloride flush  3 mL Intravenous Q12H   Continuous Infusions: . sodium chloride    . heparin 1,000 Units/hr (12/02/19 0705)  . nitroGLYCERIN 20 mcg/min (12/02/19 0600)  . tirofiban 0.15 mcg/kg/min (12/02/19 0600)   PRN Meds: sodium chloride, acetaminophen, fentaNYL (SUBLIMAZE) injection, ondansetron (ZOFRAN) IV, sodium chloride flush   Vital Signs    Vitals:   12/02/19 0500 12/02/19 0600 12/02/19 0700 12/02/19 0802  BP: 122/68 127/89 133/82   Pulse: (!) 51 (!) 55 (!) 59   Resp: 18 (!) 21 (!) 22   Temp:    98.4 F (36.9 C)  TempSrc:    Oral  SpO2: 99% 99% 100%   Weight:      Height:        Intake/Output Summary (Last 24 hours) at 12/02/2019 0845 Last data filed at 12/02/2019 0600 Gross per 24 hour  Intake 792.87 ml  Output 850 ml  Net -57.13 ml   Last 3 Weights 12/01/2019  Weight (lbs) 236 lb 8.9 oz  Weight (kg) 107.3 kg      Telemetry    Sinus rhythm and sinus bradycardia. No significant ventricular ectopy.- Personally Reviewed  ECG    Sinus bradycardia, small inferior Q waves, resolution of ST elevation.- Personally Reviewed  Physical Exam  Obese GEN: No acute distress.   Neck: No JVD Cardiac: RRR, no murmurs, rubs, or gallops.  The right radial cath site is unremarkable Respiratory: Clear to auscultation bilaterally. GI: Soft, nontender, non-distended  MS: No edema; No  deformity. Neuro:  Nonfocal  Psych: Normal affect   Labs    High Sensitivity Troponin:   Recent Labs  Lab 12/01/19 2007 12/02/19 0622  TROPONINIHS 2,168* 14      Chemistry Recent Labs  Lab 12/01/19 2007 12/02/19 0054 12/02/19 0622  NA 135 134* 136  K 4.0 4.0 3.9  CL 99 101 103  CO2 25 23 24   GLUCOSE 141* 119* 134*  BUN 12 10 7   CREATININE 1.18 1.10 1.10  CALCIUM 9.7 9.1 9.0  PROT 8.3*  --   --   ALBUMIN 4.6  --   --   AST 68*  --   --   ALT 46*  --   --   ALKPHOS 49  --   --   BILITOT 0.5  --   --   GFRNONAA >60 >60 >60  ANIONGAP 11 10 9      Hematology Recent Labs  Lab 12/01/19 2007 12/02/19 0054  WBC 7.6 8.3  RBC 5.03 4.73  HGB 15.5 14.9  HCT 45.0 43.5  MCV 89.5 92.0  MCH 30.8 31.5  MCHC 34.4 34.3  RDW 13.2 13.1  PLT 323 271    BNP Recent Labs  Lab 12/02/19 0622  BNP 158.5*     DDimer  Recent Labs  Lab 12/01/19 2105  DDIMER 0.79*     Radiology    CARDIAC CATHETERIZATION  Result Date: 12/01/2019  Acute inferior ST elevation MI commencing at around 12:00 noon.  Large globular thrombus mid RCA with distal embolization into the right coronary beyond the PDA origin and also in the very distal portion of the large acute marginal branch.  Failed angioplasty to restore flow in the distal vessel and in the ability to properly gauge the size of the vessels beyond the total occlusion.  Treatment of the mid right coronary was deferred for feel further embolization, choosing to treat with Aggrastat, P2 Y 12 therapy and heparin for additional 24 to 36 hours and then restudy the patient with definitive therapy on the mid vessel possibly using rheolytic AngioJet and if possible treatment of the distal vessel if there has been recanalization.  Left coronary system is widely patent and supplies faint collaterals to the distal RCA beyond the PDA.  Severe inferior wall hypokinesis with EF 40 to 45% and elevated EDP consistent with acute combined systolic  and diastolic heart failure. Recommendations:  Aspirin and Plavix  IV Aggrastat for 24 to 36 hours  Resume heparin in 4 to 6 hours at maintenance dose without bolus.  IV nitroglycerin.  Consider amlodipine therapy if blood pressure control is poor on IV nitroglycerin.  Plan restudy in 24 to 36 hours with the possibility of using rheolytic AngioJet if needed to treat the mid vessel.  PCI on the distal vessel if there is any distal flow and vessel visualization to allow safe management in terms of balloon and stent sizing.  DG Chest Portable 1 View  Result Date: 12/01/2019 CLINICAL DATA:  39 year old male with chest pain. EXAM: PORTABLE CHEST 1 VIEW COMPARISON:  None. FINDINGS: The heart size and mediastinal contours are within normal limits. Both lungs are clear. The visualized skeletal structures are unremarkable. IMPRESSION: No active disease. Electronically Signed   By: Elgie Collard M.D.   On: 12/01/2019 20:08    Cardiac Studies   Echocardiogram is pending  Patient Profile     39 y.o. male with no known prior heart history or risk factors, subsequently found to have hypertension, severe hyperlipidemia, and prediabetes admitted 12 hours into an inferior ST elevation MI and underwent mechanical revascularization using angioplasty but without successful reperfusion.  Hope is that there will be recanalization with pharmacologic therapy and he will subsequently have a large thrombotic plaque rupture treated 12/03/2019.  Assessment & Plan    1. Acute inferior ST elevation MI, at least 12 hours in duration prior to presentation: Feel mechanical reperfusion of distal embolic sites despite "Dotter" and angioplasty.  Currently on pharmacologic therapy preparing the thrombus feel mid RCA stenosis for intervention in a.m., possibly using rheolytic AngioJet. 2. Anticoagulation/antiplatelet regimen: Currently on Plavix, aspirin, Aggrastat, and IV heparin.  Will need to discontinue heparin prior to  Cath Lab tomorrow. 3. Primary hypertension: Current reasonable control on IV nitroglycerin loaded pain.  Beta-blocker therapy is being held because of bradycardia. 4. Severe hyperlipidemia (possibly familial): LDL 187.  High intensity statin therapy has been started.  May be a downstream candidate for PCSK9 therapy as LDL should be Less than 70. 5. Prediabetes: The hemoglobin A1c is 6. 6. Acute on chronic combined systolic and diastolic heart failure: BNP less than 200.  LVEDP greater than 20.  No clinical evidence of volume overload.  Follow closely and use diuretic therapy if needed.  For questions or updates, please contact [facility] Please consult [email or web address] for contact info under        Signed, Lesleigh Noe, MD  12/02/2019, 8:45 AM

## 2019-12-02 NOTE — Progress Notes (Signed)
CRITICAL VALUE ALERT  Critical Value:  Troponin 16,108  Date & Time Notified:  12/02/19 1056  Provider Notified:  Verdis Prime, MD  Orders Received/Actions taken:  Page acknowledged, no new orders received.

## 2019-12-02 NOTE — Progress Notes (Signed)
ANTICOAGULATION CONSULT NOTE  Pharmacy Consult for Heparin Indication: chest pain/ACS  No Known Allergies  Patient Measurements: Height: 6\' 2"  (188 cm) Weight: 107.3 kg (236 lb 8.9 oz) IBW/kg (Calculated) : 82.2 Heparin Dosing Weight: 104 kg  Vital Signs: Temp: 98.5 F (36.9 C) (11/16 1500) Temp Source: Oral (11/16 1500) BP: 106/69 (11/16 1600) Pulse Rate: 53 (11/16 1600)  Labs: Recent Labs    12/01/19 2007 12/02/19 0054 12/02/19 0622 12/02/19 0947 12/02/19 1455  HGB 15.5 14.9  --   --   --   HCT 45.0 43.5  --   --   --   PLT 323 271  --   --   --   HEPARINUNFRC  --   --   --  <0.10* 0.14*  CREATININE 1.18 1.10 1.10  --   --   TROPONINIHS 2,168*  --  14 16,108*  --     Estimated Creatinine Clearance: 119.9 mL/min (by C-G formula based on SCr of 1.1 mg/dL).   Medical History: History reviewed. No pertinent past medical history.   Assessment: 39 yom presenting with CP and started on heparin at [facility]. Code STEMI called and taken emergently to cath lab, found to have thrombus in RCA and distally occluded vessels - unable to stent. Plan per Cardiology is to continue tirofiban and  heparin  with plans to return to cath lab on 11/17 -heparin level = 0.14    Goal of Therapy:  Heparin level 0.3-0.5 units/ml Monitor platelets by anticoagulation protocol: Yes   Plan:  -increase heparin to 1200 units/hr -Heparin level in 6 hours and daily wth CBC daily  12/17, PharmD Clinical Pharmacist **Pharmacist phone directory can now be found on [email or web address] ([email or web address]).  Listed under [facility].

## 2019-12-02 NOTE — Progress Notes (Signed)
  Echocardiogram 2D Echocardiogram has been performed.  Tyrone Carter 12/02/2019, 9:28 AM

## 2019-12-03 ENCOUNTER — Encounter (HOSPITAL_COMMUNITY): Payer: Self-pay | Admitting: Cardiology

## 2019-12-03 ENCOUNTER — Encounter (HOSPITAL_COMMUNITY)
Admission: EM | Disposition: A | Discharge status: Home / Self Care | Payer: Self-pay | Source: Home / Self Care | Attending: Interventional Cardiology

## 2019-12-03 DIAGNOSIS — Z794 Long term (current) use of insulin: Secondary | ICD-10-CM

## 2019-12-03 DIAGNOSIS — E118 Type 2 diabetes mellitus with unspecified complications: Secondary | ICD-10-CM

## 2019-12-03 HISTORY — PX: CORONARY STENT INTERVENTION: CATH118234

## 2019-12-03 LAB — CBC
HCT: 40.8 % (ref 39.0–52.0)
Hemoglobin: 14.1 g/dL (ref 13.0–17.0)
MCH: 31 pg (ref 26.0–34.0)
MCHC: 34.6 g/dL (ref 30.0–36.0)
MCV: 89.7 fL (ref 80.0–100.0)
Platelets: 239 10*3/uL (ref 150–400)
RBC: 4.55 MIL/uL (ref 4.22–5.81)
RDW: 13.2 % (ref 11.5–15.5)
WBC: 8.8 10*3/uL (ref 4.0–10.5)
nRBC: 0 % (ref 0.0–0.2)

## 2019-12-03 LAB — POCT ACTIVATED CLOTTING TIME: Activated Clotting Time: 417 seconds

## 2019-12-03 LAB — CREATININE, SERUM
Creatinine, Ser: 1.16 mg/dL (ref 0.61–1.24)
GFR, Estimated: >60 mL/min (ref >60)

## 2019-12-03 LAB — HIV ANTIBODY (ROUTINE TESTING W REFLEX): HIV Screen 4th Generation wRfx: NONREACTIVE

## 2019-12-03 LAB — HEPARIN LEVEL (UNFRACTIONATED)
Heparin Unfractionated: 0.16 IU/mL — ABNORMAL LOW (ref 0.30–0.70)
Heparin Unfractionated: 0.37 IU/mL (ref 0.30–0.70)

## 2019-12-03 SURGERY — CORONARY STENT INTERVENTION
Anesthesia: LOCAL

## 2019-12-03 MED ORDER — SODIUM CHLORIDE 0.9 % WEIGHT BASED INFUSION
1.0000 mL/kg/h | INTRAVENOUS | Status: DC
  Administered 2019-12-03: 1 mL/kg/h via INTRAVENOUS

## 2019-12-03 MED ORDER — SODIUM CHLORIDE 0.9% FLUSH: 3.0000 mL | INTRAVENOUS | Status: DC | PRN

## 2019-12-03 MED ORDER — VERAPAMIL HCL 2.5 MG/ML IV SOLN
INTRAVENOUS | Status: AC
  Filled 2019-12-03: qty 2

## 2019-12-03 MED ORDER — NITROGLYCERIN 1 MG/10 ML FOR IR/CATH LAB
INTRA_ARTERIAL | Status: AC
  Filled 2019-12-03: qty 10

## 2019-12-03 MED ORDER — HEPARIN SODIUM (PORCINE) 1000 UNIT/ML IJ SOLN
INTRAMUSCULAR | Status: AC
  Filled 2019-12-03: qty 1

## 2019-12-03 MED ORDER — LIDOCAINE HCL (PF) 1 % IJ SOLN
INTRAMUSCULAR | Status: AC
  Filled 2019-12-03: qty 30

## 2019-12-03 MED ORDER — TIROFIBAN HCL IN NACL 5-0.9 MG/100ML-% IV SOLN
0.1500 ug/kg/min | INTRAVENOUS | Status: AC
  Administered 2019-12-03 – 2019-12-04 (×4): 0.15 ug/kg/min via INTRAVENOUS
  Filled 2019-12-03 (×4): qty 100

## 2019-12-03 MED ORDER — LIDOCAINE HCL (PF) 1 % IJ SOLN
INTRAMUSCULAR | Status: DC | PRN
  Administered 2019-12-03: 2 mL

## 2019-12-03 MED ORDER — FENTANYL CITRATE (PF) 100 MCG/2ML IJ SOLN
INTRAMUSCULAR | Status: DC | PRN
  Administered 2019-12-03: 25 ug via INTRAVENOUS

## 2019-12-03 MED ORDER — HEPARIN (PORCINE) IN NACL 1000-0.9 UT/500ML-% IV SOLN
INTRAVENOUS | Status: AC
  Filled 2019-12-03: qty 500

## 2019-12-03 MED ORDER — IOHEXOL 350 MG/ML SOLN
INTRAVENOUS | Status: DC | PRN
  Administered 2019-12-03: 100 mL

## 2019-12-03 MED ORDER — IOHEXOL 350 MG/ML SOLN
INTRAVENOUS | Status: AC
  Filled 2019-12-03: qty 1

## 2019-12-03 MED ORDER — SODIUM CHLORIDE 0.9 % IV SOLN: 250.0000 mL | INTRAVENOUS | Status: DC | PRN

## 2019-12-03 MED ORDER — VERAPAMIL HCL 2.5 MG/ML IV SOLN
INTRAVENOUS | Status: DC | PRN
  Administered 2019-12-03: 10 mL via INTRA_ARTERIAL

## 2019-12-03 MED ORDER — HEPARIN SODIUM (PORCINE) 1000 UNIT/ML IJ SOLN
INTRAMUSCULAR | Status: DC | PRN
  Administered 2019-12-03: 6000 [IU] via INTRAVENOUS
  Administered 2019-12-03: 5000 [IU] via INTRAVENOUS

## 2019-12-03 MED ORDER — MIDAZOLAM HCL 2 MG/2ML IJ SOLN
INTRAMUSCULAR | Status: AC
  Filled 2019-12-03: qty 2

## 2019-12-03 MED ORDER — SODIUM CHLORIDE 0.9% FLUSH: 3.0000 mL | Freq: Two times a day (BID) | INTRAVENOUS | Status: DC

## 2019-12-03 MED ORDER — HEPARIN (PORCINE) IN NACL 1000-0.9 UT/500ML-% IV SOLN
INTRAVENOUS | Status: DC | PRN
  Administered 2019-12-03 (×2): 500 mL

## 2019-12-03 MED ORDER — HEPARIN SODIUM (PORCINE) 5000 UNIT/ML IJ SOLN
5000.0000 [IU] | Freq: Three times a day (TID) | INTRAMUSCULAR | Status: DC
  Administered 2019-12-03 – 2019-12-04 (×2): 5000 [IU] via SUBCUTANEOUS
  Filled 2019-12-03 (×2): qty 1

## 2019-12-03 MED ORDER — FENTANYL CITRATE (PF) 100 MCG/2ML IJ SOLN
INTRAMUSCULAR | Status: AC
  Filled 2019-12-03: qty 2

## 2019-12-03 MED ORDER — SODIUM CHLORIDE 0.9% FLUSH
3.0000 mL | Freq: Two times a day (BID) | INTRAVENOUS | Status: DC
  Administered 2019-12-03 – 2019-12-04 (×2): 3 mL via INTRAVENOUS

## 2019-12-03 MED ORDER — MIDAZOLAM HCL 2 MG/2ML IJ SOLN
INTRAMUSCULAR | Status: DC | PRN
  Administered 2019-12-03: 1 mg via INTRAVENOUS

## 2019-12-03 SURGICAL SUPPLY — 15 items
CATH EXTRAC PRONTO 5.5F 138CM (CATHETERS) ×1 IMPLANT
CATH OPTICROSS HD (CATHETERS) ×1 IMPLANT
CATH VISTA GUIDE 6FR JR4 (CATHETERS) ×1 IMPLANT
DEVICE RAD COMP TR BAND LRG (VASCULAR PRODUCTS) ×1 IMPLANT
GLIDESHEATH SLEND SS 6F .021 (SHEATH) ×1 IMPLANT
GUIDEWIRE INQWIRE 1.5J.035X260 (WIRE) IMPLANT
INQWIRE 1.5J .035X260CM (WIRE) ×2
KIT ENCORE 26 ADVANTAGE (KITS) ×1 IMPLANT
KIT HEART LEFT (KITS) ×2 IMPLANT
PACK CARDIAC CATHETERIZATION (CUSTOM PROCEDURE TRAY) ×2 IMPLANT
SLED PULL BACK IVUS (MISCELLANEOUS) ×1 IMPLANT
STENT RESOLUTE ONYX 4.0X15 (Permanent Stent) ×1 IMPLANT
TRANSDUCER W/STOPCOCK (MISCELLANEOUS) ×2 IMPLANT
TUBING CIL FLEX 10 FLL-RA (TUBING) ×2 IMPLANT
WIRE ASAHI PROWATER 180CM (WIRE) ×1 IMPLANT

## 2019-12-03 NOTE — Plan of Care (Signed)
  Problem: Cardiovascular: Goal: Vascular access site(s) Level 0-1 will be maintained Outcome: Progressing   Problem: Education: Goal: Knowledge of General Education information will improve Description: Including pain rating scale, medication(s)/side effects and non-pharmacologic comfort measures Outcome: Progressing   Problem: Health Behavior/Discharge Planning: Goal: Ability to manage health-related needs will improve Outcome: Progressing   Problem: Clinical Measurements: Goal: Ability to maintain clinical measurements within normal limits will improve Outcome: Progressing Goal: Will remain free from infection Outcome: Progressing Goal: Diagnostic test results will improve Outcome: Progressing Goal: Respiratory complications will improve Outcome: Progressing Goal: Cardiovascular complication will be avoided Outcome: Progressing   Problem: Activity: Goal: Risk for activity intolerance will decrease Outcome: Progressing   Problem: Nutrition: Goal: Adequate nutrition will be maintained Outcome: Progressing   Problem: Coping: Goal: Level of anxiety will decrease Outcome: Progressing   Problem: Elimination: Goal: Will not experience complications related to bowel motility Outcome: Progressing Goal: Will not experience complications related to urinary retention Outcome: Progressing   Problem: Pain Managment: Goal: General experience of comfort will improve Outcome: Progressing   Problem: Safety: Goal: Ability to remain free from injury will improve Outcome: Progressing   Problem: Skin Integrity: Goal: Risk for impaired skin integrity will decrease Outcome: Progressing

## 2019-12-03 NOTE — Progress Notes (Signed)
CARDIAC REHAB PHASE I   PRE:  Rate/Rhythm: 62 SR  BP:  Sitting: 135/90      SaO2: 99 RA  MODE:  Ambulation: 740 ft   POST:  Rate/Rhythm: 82 SR  BP:  Sitting: 138/101    SaO2: 100 RA  Pt ambulated 783ft in hallway independently with steady gait. Pt denies CP, SOB, or dizziness. States he feels much better today. Began reviewing restictions and exercise guidelines. Will return tomorrow to complete MI education.  [id number] Reynold Bowen, RN BSN 12/03/2019 3:25 PM

## 2019-12-03 NOTE — Progress Notes (Signed)
ANTICOAGULATION CONSULT NOTE  Pharmacy Consult for Heparin Indication: chest pain/ACS  Assessment: 39 yom presenting with CP and started on heparin at [facility]. Code STEMI called and taken emergently to cath lab, found to have thrombus in RCA and distally occluded vessels - unable to stent. Plan per Cardiology is to continue tirofiban and  heparin  with plans to return to cath lab on 11/17 -heparin level = 0.16   Goal of Therapy:  Heparin level 0.3-0.5 units/ml Monitor platelets by anticoagulation protocol: Yes   Plan:  -increase heparin to 1500 units/hr -f/u after cath  Thanks for allowing pharmacy to be a part of this patient's care.  Talbert Cage, PharmD Clinical Pharmacist

## 2019-12-03 NOTE — H&P (View-Only) (Signed)
Progress Note  Patient Name: Tyrone Carter Date of Encounter: 12/03/2019  [facility] Cardiologist: Lesleigh Noe, MD   Subjective   Quiet night.  Was able to get some sleep.  Denies dyspnea.  Inpatient Medications    Scheduled Meds: . amLODipine  5 mg Oral Daily  . aspirin  81 mg Oral Daily  . atorvastatin  80 mg Oral Daily  . Chlorhexidine Gluconate Cloth  6 each Topical Daily  . clopidogrel  75 mg Oral Q breakfast  . influenza vac split quadrivalent PF  0.5 mL Intramuscular Tomorrow-1000  . sodium chloride flush  3 mL Intravenous Q12H  . sodium chloride flush  3 mL Intravenous Q12H   Continuous Infusions: . sodium chloride    . sodium chloride    . sodium chloride 1 mL/kg/hr (12/03/19 0559)  . heparin 1,500 Units/hr (12/03/19 2585)  . nitroGLYCERIN 20 mcg/min (12/03/19 0300)  . tirofiban 0.15 mcg/kg/min (12/03/19 0645)   PRN Meds: sodium chloride, sodium chloride, acetaminophen, fentaNYL (SUBLIMAZE) injection, ondansetron (ZOFRAN) IV, sodium chloride flush, sodium chloride flush   Vital Signs    Vitals:   12/03/19 0300 12/03/19 0400 12/03/19 0500 12/03/19 0600  BP: 112/72  120/75 106/73  Pulse: (!) 56  (!) 54 (!) 56  Resp: (!) 21  (!) 22 (!) 21  Temp:  99.4 F (37.4 C)    TempSrc:  Oral    SpO2: 99%  99% 98%  Weight:    106.7 kg  Height:        Intake/Output Summary (Last 24 hours) at 12/03/2019 0654 Last data filed at 12/03/2019 0300 Gross per 24 hour  Intake 831.25 ml  Output 2770 ml  Net -1938.75 ml   Last 3 Weights 12/03/2019 12/01/2019  Weight (lbs) 235 lb 3.7 oz 236 lb 8.9 oz  Weight (kg) 106.7 kg 107.3 kg      Telemetry    Sinus rhythm and sinus bradycardia. No significant ventricular ectopy.- Personally Reviewed  ECG    Sinus bradycardia with continued evolution of inferior infarction now with T wave inversion in 2, 3, and aVF..- Personally Reviewed  Physical Exam  Obese GEN: No acute distress.   Neck: No JVD Cardiac:  RRR, no murmurs, rubs, or gallops.  The right radial cath site is unremarkable Respiratory: Clear to auscultation bilaterally. GI: Soft, nontender, non-distended  MS: No edema; No deformity. Neuro:  Nonfocal  Psych: Normal affect   Labs    High Sensitivity Troponin:   Recent Labs  Lab 12/01/19 2007 12/02/19 0622 12/02/19 0947  TROPONINIHS 2,168* 14 16,108*      Chemistry Recent Labs  Lab 12/01/19 2007 12/02/19 0054 12/02/19 0622  NA 135 134* 136  K 4.0 4.0 3.9  CL 99 101 103  CO2 25 23 24   GLUCOSE 141* 119* 134*  BUN 12 10 7   CREATININE 1.18 1.10 1.10  CALCIUM 9.7 9.1 9.0  PROT 8.3*  --   --   ALBUMIN 4.6  --   --   AST 68*  --   --   ALT 46*  --   --   ALKPHOS 49  --   --   BILITOT 0.5  --   --   GFRNONAA >60 >60 >60  ANIONGAP 11 10 9      Hematology Recent Labs  Lab 12/01/19 2007 12/02/19 0054 12/03/19 0318  WBC 7.6 8.3 8.8  RBC 5.03 4.73 4.55  HGB 15.5 14.9 14.1  HCT 45.0 43.5 40.8  MCV 89.5  92.0 89.7  MCH 30.8 31.5 31.0  MCHC 34.4 34.3 34.6  RDW 13.2 13.1 13.2  PLT 323 271 239    BNP Recent Labs  Lab 12/02/19 0622  BNP 158.5*     DDimer  Recent Labs  Lab 12/01/19 2105  DDIMER 0.79*     Radiology    CARDIAC CATHETERIZATION  Addendum Date: 12/02/2019    Acute inferior ST elevation MI commencing at around 12:00 noon.  Large globular thrombus mid RCA with distal embolization into the distal right coronary and also in the very distal portion of a large acute marginal branch.  Failure of balloon angioplasty assisted PCI to restore flow in the distal vessel.   Left coronary system is widely patent but with relatively diffuse intimal irregularity.  Faint left to right collaterals to the distal RCA beyond the PDA were noted.  Severe inferior wall hypokinesis with EF 40 to 45% with LVEDP 24 mmHg and consistent with acute combined systolic and diastolic heart failure. Recommendations:  Aspirin and Plavix  IV Aggrastat for 24 to 36 hours   Resume heparin in 4 to 6 hours at maintenance dose without bolus.  IV nitroglycerin.  Consider amlodipine therapy if blood pressure control is poor on IV nitroglycerin.  Plan restudy in 24 to 36 hours with the possibility of using rheolytic AngioJet or catheter-based thrombectomy if needed to treat the mid vessel.  PCI on the distal vessel if there is any distal flow and visualization.  Result Date: 12/01/2019  Acute inferior ST elevation MI commencing at around 12:00 noon.  Large globular thrombus mid RCA with distal embolization into the right coronary beyond the PDA origin and also in the very distal portion of the large acute marginal branch.  Failed angioplasty to restore flow in the distal vessel and in the ability to properly gauge the size of the vessels beyond the total occlusion.  Treatment of the mid right coronary was deferred for feel further embolization, choosing to treat with Aggrastat, P2 Y 12 therapy and heparin for additional 24 to 36 hours and then restudy the patient with definitive therapy on the mid vessel possibly using rheolytic AngioJet and if possible treatment of the distal vessel if there has been recanalization.  Left coronary system is widely patent and supplies faint collaterals to the distal RCA beyond the PDA.  Severe inferior wall hypokinesis with EF 40 to 45% and elevated EDP consistent with acute combined systolic and diastolic heart failure. Recommendations:  Aspirin and Plavix  IV Aggrastat for 24 to 36 hours  Resume heparin in 4 to 6 hours at maintenance dose without bolus.  IV nitroglycerin.  Consider amlodipine therapy if blood pressure control is poor on IV nitroglycerin.  Plan restudy in 24 to 36 hours with the possibility of using rheolytic AngioJet if needed to treat the mid vessel.  PCI on the distal vessel if there is any distal flow and vessel visualization to allow safe management in terms of balloon and stent sizing.  DG Chest Portable 1  View  Result Date: 12/01/2019 CLINICAL DATA:  39 year old male with chest pain. EXAM: PORTABLE CHEST 1 VIEW COMPARISON:  None. FINDINGS: The heart size and mediastinal contours are within normal limits. Both lungs are clear. The visualized skeletal structures are unremarkable. IMPRESSION: No active disease. Electronically Signed   By: Elgie Collard M.D.   On: 12/01/2019 20:08   ECHOCARDIOGRAM COMPLETE  Result Date: 12/02/2019    ECHOCARDIOGRAM REPORT   Patient Name:   Tyrone Carter  Date of Exam: 12/02/2019 Medical Rec #:  [id number]         Height:       74.0 in Accession #:    [id number]        Weight:       236.6 lb Date of Birth:  1980-12-09         BSA:          2.334 m Patient Age:    39 years          BP:           133/82 mmHg Patient Gender: M                 HR:           56 bpm. Exam Location:  Inpatient Procedure: 2D Echo Indications:    Stemi I21.3  History:        Patient has no prior history of Echocardiogram examinations.  Sonographer:    Thurman Coyer RDCS (AE) Referring Phys: [id number] AMANDA C CONIGLIO IMPRESSIONS  1. Left ventricular ejection fraction, by estimation, is 55 to 60%. The left ventricle has normal function. Left ventricular endocardial border not optimally defined to fully evaluate regional wall motion, however inferoseptum appears hypokinetic from apex to base. There is mild left ventricular hypertrophy. Left ventricular diastolic parameters were normal.  2. Right ventricular systolic function is normal. The right ventricular size is normal.  3. The mitral valve is normal in structure. No evidence of mitral valve regurgitation. No evidence of mitral stenosis.  4. The aortic valve is tricuspid. Aortic valve regurgitation is not visualized. No aortic stenosis is present.  5. The inferior vena cava is normal in size with greater than 50% respiratory variability, suggesting right atrial pressure of 3 mmHg. FINDINGS  Left Ventricle: Left ventricular ejection fraction, by  estimation, is 55 to 60%. The left ventricle has normal function. Left ventricular endocardial border not optimally defined to evaluate regional wall motion. The left ventricular internal cavity size was normal in size. There is mild left ventricular hypertrophy. Left ventricular diastolic parameters were normal.  LV Wall Scoring: The inferior septum is hypokinetic. Right Ventricle: The right ventricular size is normal. No increase in right ventricular wall thickness. Right ventricular systolic function is normal. Left Atrium: Left atrial size was normal in size. Right Atrium: Right atrial size was normal in size. Pericardium: There is no evidence of pericardial effusion. Mitral Valve: The mitral valve is normal in structure. No evidence of mitral valve regurgitation. No evidence of mitral valve stenosis. Tricuspid Valve: The tricuspid valve is normal in structure. Tricuspid valve regurgitation is not demonstrated. No evidence of tricuspid stenosis. Aortic Valve: The aortic valve is tricuspid. Aortic valve regurgitation is not visualized. No aortic stenosis is present. Pulmonic Valve: The pulmonic valve was normal in structure. Pulmonic valve regurgitation is not visualized. No evidence of pulmonic stenosis. Aorta: The aortic root is normal in size and structure. Venous: The inferior vena cava is normal in size with greater than 50% respiratory variability, suggesting right atrial pressure of 3 mmHg. IAS/Shunts: No atrial level shunt detected by color flow Doppler.  LEFT VENTRICLE PLAX 2D LVIDd:         4.40 cm  Diastology LVIDs:         3.20 cm  LV e' medial:    8.92 cm/s LV PW:         1.10 cm  LV E/e' medial:  7.7 LV IVS:  1.10 cm  LV e' lateral:   9.79 cm/s LVOT diam:     2.30 cm  LV E/e' lateral: 7.0 LV SV:         73 LV SV Index:   31 LVOT Area:     4.15 cm  RIGHT VENTRICLE RV S prime:     15.10 cm/s TAPSE (M-mode): 1.7 cm LEFT ATRIUM             Index       RIGHT ATRIUM           Index LA diam:         4.20 cm 1.80 cm/m  RA Area:     14.80 cm LA Vol (A2C):   36.4 ml 15.59 ml/m RA Volume:   37.10 ml  15.89 ml/m LA Vol (A4C):   41.4 ml 17.74 ml/m LA Biplane Vol: 40.9 ml 17.52 ml/m  AORTIC VALVE LVOT Vmax:   87.20 cm/s LVOT Vmean:  52.100 cm/s LVOT VTI:    0.175 m  AORTA Ao Root diam: 3.00 cm MITRAL VALVE MV Area (PHT): 2.29 cm    SHUNTS MV Decel Time: 331 msec    Systemic VTI:  0.18 m MV E velocity: 68.65 cm/s  Systemic Diam: 2.30 cm MV A velocity: 53.80 cm/s MV E/A ratio:  1.28 Weston Brass MD Electronically signed by Weston Brass MD Signature Date/Time: 12/02/2019/10:36:59 AM    Final     Cardiac Studies   Echocardiogram and cath reports as noted above  Patient Profile     39 y.o. male with no known prior heart history or risk factors, subsequently found to have hypertension, severe hyperlipidemia, and prediabetes admitted 12 hours into an inferior ST elevation MI and underwent mechanical revascularization using angioplasty but without successful reperfusion.  Hope is that there will be recanalization with pharmacologic therapy and he will subsequently have a large thrombotic plaque rupture treated 12/03/2019.  Assessment & Plan    1. Acute inferior ST elevation MI, will have repeat cath today, hopeful intervention on mid RCA, and also distal vessel if appropriate substrate.  May need AngioJet if still globular thrombus in mid RCA.  Peak troponin I 16,000. 2. Anticoagulation/antiplatelet regimen: Aggrastat and heparin will be discontinued.  Aspirin and Plavix will be continued post PCI. 3. Primary hypertension: Excellent blood pressure control on amlodipine.  Beta-blocker therapy on hold because of bradycardia. 4. Severe hyperlipidemia (possibly familial): LDL 187.  High intensity statin therapy  5. Acute on chronic combined systolic and diastolic heart failure: Echocardiogram suggest EF of 55% with inferior wall hypokinesis.  Beta-blockers have been on hold due to relative  bradycardia.  With normal LV function, beta-blocker therapy is not mandatory.  We will try to add low-dose beta-blocker therapy after successful PCI. 6. Diabetes mellitus type 2: We will initiate diabetes teaching post PCI.  For questions or updates, please contact [facility] Please consult [email or web address] for contact info under        Signed, Lesleigh Noe, MD  12/03/2019, 6:54 AM

## 2019-12-03 NOTE — Interval H&P Note (Signed)
History and Physical Interval Note:  12/03/2019 8:32 AM  Tyrone Carter  has presented today for surgery, with the diagnosis of cad.  The various methods of treatment have been discussed with the patient and family. After consideration of risks, benefits and other options for treatment, the patient has consented to  Procedure(s): CORONARY STENT INTERVENTION (N/A) as a surgical intervention.  The patient's history has been reviewed, patient examined, no change in status, stable for surgery.  I have reviewed the patient's chart and labs.  Questions were answered to the patient's satisfaction.   Cath Lab Visit (complete for each Cath Lab visit)  Clinical Evaluation Leading to the Procedure:   ACS: Yes.    Non-ACS:    Anginal Classification: CCS II  Anti-ischemic medical therapy: Maximal Therapy (2 or more classes of medications)  Non-Invasive Test Results: No non-invasive testing performed  Prior CABG: No previous CABG        Theron Arista [facility] 12/03/2019 8:32 AM

## 2019-12-03 NOTE — Progress Notes (Signed)
ANTICOAGULATION CONSULT NOTE  Pharmacy Consult for Heparin Indication: chest pain/ACS  Assessment: 39 yom presenting with CP and started on heparin at [facility]. Code STEMI called and taken emergently to cath lab, found to have thrombus in RCA and distally occluded vessels - unable to stent. Plan per Cardiology is to continue tirofiban and heparin  with plans to return to cath lab on 11/17. Heparin level this morning is therapeutic at 0.37 on 1500 units/hr.   Goal of Therapy:  Heparin level 0.3-0.5 units/ml Monitor platelets by anticoagulation protocol: Yes   Plan:  - Continue heparin 1500 units/hr  - Follow up plans for continued [facility] post cath  - Monitor for s/sx of bleed   Thanks for allowing pharmacy to be a part of this patient's care.  Isaias Sakai, PharmD, MBA Pharmacy Resident [phone] 12/03/2019 7:34 AM

## 2019-12-03 NOTE — Progress Notes (Signed)
Progress Note  Patient Name: Tyrone Carter Date of Encounter: 12/04/2019  [facility] Cardiologist: Lesleigh Noe, MD   Subjective   He is doing well.  He denies angina.  No orthopnea or PND.  Inpatient Medications    Scheduled Meds: . amLODipine  5 mg Oral Daily  . aspirin  81 mg Oral Daily  . atorvastatin  80 mg Oral Daily  . clopidogrel  75 mg Oral Q breakfast  . heparin  5,000 Units Subcutaneous Q8H  . influenza vac split quadrivalent PF  0.5 mL Intramuscular Tomorrow-1000  . sodium chloride flush  3 mL Intravenous Q12H  . sodium chloride flush  3 mL Intravenous Q12H   Continuous Infusions: . sodium chloride    . sodium chloride     PRN Meds: sodium chloride, sodium chloride, acetaminophen, fentaNYL (SUBLIMAZE) injection, ondansetron (ZOFRAN) IV, sodium chloride flush, sodium chloride flush   Vital Signs    Vitals:   12/04/19 0508 12/04/19 0758 12/04/19 0850 12/04/19 1125  BP: 114/76 (!) 129/93 120/71 125/89  Pulse: 74 73 71 67  Resp: 18 18 16 18   Temp: 99 F (37.2 C) 98.7 F (37.1 C) 98.8 F (37.1 C) 98.2 F (36.8 C)  TempSrc: Oral Oral Oral Oral  SpO2: 99% 99% 98% 99%  Weight: 106.7 kg     Height:        Intake/Output Summary (Last 24 hours) at 12/04/2019 1202 Last data filed at 12/04/2019 0900 Gross per 24 hour  Intake 1452.96 ml  Output 2175 ml  Net -722.04 ml   Last 3 Weights 12/04/2019 12/03/2019 12/01/2019  Weight (lbs) 235 lb 3.2 oz 235 lb 3.7 oz 236 lb 8.9 oz  Weight (kg) 106.686 kg 106.7 kg 107.3 kg      Telemetry    Sinus rhythm and sinus bradycardia. No significant ventricular ectopy.- Personally Reviewed  ECG    Sinus bradycardia with continued evolution of inferior infarction now with T wave inversion in 2, 3, and aVF..- Personally Reviewed  Physical Exam  Obese GEN: No acute distress.   Neck: No JVD Cardiac: RRR, no murmurs, rubs.  There is an S4 gallop.  The right radial cath site is unremarkable Respiratory:  Clear to auscultation bilaterally. GI: Soft, nontender, non-distended  MS: No edema; No deformity. Neuro:  Nonfocal  Psych: Normal affect   Labs    High Sensitivity Troponin:   Recent Labs  Lab 12/01/19 2007 12/02/19 0622 12/02/19 0947  TROPONINIHS 2,168* 14 16,108*      Chemistry Recent Labs  Lab 12/01/19 2007 12/01/19 2007 12/02/19 0054 12/02/19 0054 12/02/19 0622 12/03/19 1057 12/04/19 0341  NA 135   < > 134*  --  136  --  139  K 4.0   < > 4.0  --  3.9  --  3.9  CL 99   < > 101  --  103  --  105  CO2 25   < > 23  --  24  --  25  GLUCOSE 141*   < > 119*  --  134*  --  102*  BUN 12   < > 10  --  7  --  7  CREATININE 1.18   < > 1.10   < > 1.10 1.16 1.14  CALCIUM 9.7   < > 9.1  --  9.0  --  8.9  PROT 8.3*  --   --   --   --   --   --  ALBUMIN 4.6  --   --   --   --   --   --   AST 68*  --   --   --   --   --   --   ALT 46*  --   --   --   --   --   --   ALKPHOS 49  --   --   --   --   --   --   BILITOT 0.5  --   --   --   --   --   --   GFRNONAA >60   < > >60   < > >60 >60 >60  ANIONGAP 11   < > 10  --  9  --  9   < > = values in this interval not displayed.     Hematology Recent Labs  Lab 12/02/19 0054 12/03/19 0318 12/04/19 0341  WBC 8.3 8.8 7.7  RBC 4.73 4.55 4.38  HGB 14.9 14.1 13.6  HCT 43.5 40.8 40.1  MCV 92.0 89.7 91.6  MCH 31.5 31.0 31.1  MCHC 34.3 34.6 33.9  RDW 13.1 13.2 13.4  PLT 271 239 212    BNP Recent Labs  Lab 12/02/19 0622  BNP 158.5*     DDimer  Recent Labs  Lab 12/01/19 2105  DDIMER 0.79*     Radiology    CARDIAC CATHETERIZATION  Result Date: 12/03/2019  Prox RCA to Mid RCA lesion is 85% stenosed.  Dist RCA lesion is 99% stenosed with 99% stenosed side branch in RPAV.  Acute Mrg lesion is 99% stenosed.  Post intervention, there is a 0% residual stenosis.  A drug-eluting stent was successfully placed using a STENT RESOLUTE ONYX 4.0X15.  1. Successful PCI of the mid RCA with DES x 1 using IVUS guidance. 2.  Unsuccessful aspiration thrombectomy of the PDA and PLOM but compared to initial angiogram there has been some recanalization with improved flow. Recommend: continue IV Aggrastat another 18 hours. DAPT for one year.    Cardiac Studies  2D Doppler echocardiogram 12/02/2019: IMPRESSIONS    1. Left ventricular ejection fraction, by estimation, is 55 to 60%. The  left ventricle has normal function. Left ventricular endocardial border  not optimally defined to fully evaluate regional wall motion, however  inferoseptum appears hypokinetic from  apex to base. There is mild left ventricular hypertrophy. Left ventricular  diastolic parameters were normal.  2. Right ventricular systolic function is normal. The right ventricular  size is normal.  3. The mitral valve is normal in structure. No evidence of mitral valve  regurgitation. No evidence of mitral stenosis.  4. The aortic valve is tricuspid. Aortic valve regurgitation is not  visualized. No aortic stenosis is present.  5. The inferior vena cava is normal in size with greater than 50%  respiratory variability, suggesting right atrial pressure of 3 mmHg.    PCI results from 12/03/2019:  Diagnostic Dominance: Right  Intervention     Patient Profile     39 y.o. male with no known prior heart history or risk factors, subsequently found to have hypertension, severe hyperlipidemia, and prediabetes admitted 12 hours into an inferior ST elevation MI and underwent mechanical revascularization using angioplasty but without successful reperfusion.  Hope is that there will be recanalization with pharmacologic therapy and he will subsequently have a large thrombotic plaque rupture treated 12/03/2019.  Assessment & Plan    1. Acute inferior ST elevation MI: Symptoms have resolved:  Successful PCI and stenting of the mid RCA without further distal embolization of thrombus.  Pharmacotherapy improved patency of distal vessels as noted above.   Distal territory was too small to intervene upon with PCI.  Plan discharge today. 2. Anticoagulation/antiplatelet: Aspirin and Plavix for at least 12 months.: 3. Primary hypertension: Add beta-blocker, Toprol-XL 25 mg/day.  Has normal LV function and therefore amlodipine is reasonable therapy for blood pressure. 4. Severe hyperlipidemia (possibly familial): Continue high intensity atorvastatin.  Lipid panel in 6 to 8 weeks. 5. Acute on chronic combined systolic and diastolic heart failure: LVEDP was elevated during the acute infarction.  No symptoms of angina.  LV systolic function is normal. 6. Diabetes mellitus type 2: Diet, weight loss, and consideration of pharmacotherapy in the future if A1c is not well controlled.   Plan discharge today.  Clinical follow-up in 2 weeks.  Long discussion concerning risk factor modification including compliance with medical regimen especially dual antiplatelet therapy.  I encouraged CPR II.  For questions or updates, please contact [facility] Please consult [email or web address] for contact info under        Signed, Lesleigh Noe, MD  12/04/2019, 12:02 PM

## 2019-12-03 NOTE — Progress Notes (Signed)
Progress Note  Patient Name: Tyrone Carter Date of Encounter: 12/03/2019  [facility] Cardiologist: Lesleigh Noe, MD   Subjective   Quiet night.  Was able to get some sleep.  Denies dyspnea.  Inpatient Medications    Scheduled Meds: . amLODipine  5 mg Oral Daily  . aspirin  81 mg Oral Daily  . atorvastatin  80 mg Oral Daily  . Chlorhexidine Gluconate Cloth  6 each Topical Daily  . clopidogrel  75 mg Oral Q breakfast  . influenza vac split quadrivalent PF  0.5 mL Intramuscular Tomorrow-1000  . sodium chloride flush  3 mL Intravenous Q12H  . sodium chloride flush  3 mL Intravenous Q12H   Continuous Infusions: . sodium chloride    . sodium chloride    . sodium chloride 1 mL/kg/hr (12/03/19 0559)  . heparin 1,500 Units/hr (12/03/19 2585)  . nitroGLYCERIN 20 mcg/min (12/03/19 0300)  . tirofiban 0.15 mcg/kg/min (12/03/19 0645)   PRN Meds: sodium chloride, sodium chloride, acetaminophen, fentaNYL (SUBLIMAZE) injection, ondansetron (ZOFRAN) IV, sodium chloride flush, sodium chloride flush   Vital Signs    Vitals:   12/03/19 0300 12/03/19 0400 12/03/19 0500 12/03/19 0600  BP: 112/72  120/75 106/73  Pulse: (!) 56  (!) 54 (!) 56  Resp: (!) 21  (!) 22 (!) 21  Temp:  99.4 F (37.4 C)    TempSrc:  Oral    SpO2: 99%  99% 98%  Weight:    106.7 kg  Height:        Intake/Output Summary (Last 24 hours) at 12/03/2019 0654 Last data filed at 12/03/2019 0300 Gross per 24 hour  Intake 831.25 ml  Output 2770 ml  Net -1938.75 ml   Last 3 Weights 12/03/2019 12/01/2019  Weight (lbs) 235 lb 3.7 oz 236 lb 8.9 oz  Weight (kg) 106.7 kg 107.3 kg      Telemetry    Sinus rhythm and sinus bradycardia. No significant ventricular ectopy.- Personally Reviewed  ECG    Sinus bradycardia with continued evolution of inferior infarction now with T wave inversion in 2, 3, and aVF..- Personally Reviewed  Physical Exam  Obese GEN: No acute distress.   Neck: No JVD Cardiac:  RRR, no murmurs, rubs, or gallops.  The right radial cath site is unremarkable Respiratory: Clear to auscultation bilaterally. GI: Soft, nontender, non-distended  MS: No edema; No deformity. Neuro:  Nonfocal  Psych: Normal affect   Labs    High Sensitivity Troponin:   Recent Labs  Lab 12/01/19 2007 12/02/19 0622 12/02/19 0947  TROPONINIHS 2,168* 14 16,108*      Chemistry Recent Labs  Lab 12/01/19 2007 12/02/19 0054 12/02/19 0622  NA 135 134* 136  K 4.0 4.0 3.9  CL 99 101 103  CO2 25 23 24   GLUCOSE 141* 119* 134*  BUN 12 10 7   CREATININE 1.18 1.10 1.10  CALCIUM 9.7 9.1 9.0  PROT 8.3*  --   --   ALBUMIN 4.6  --   --   AST 68*  --   --   ALT 46*  --   --   ALKPHOS 49  --   --   BILITOT 0.5  --   --   GFRNONAA >60 >60 >60  ANIONGAP 11 10 9      Hematology Recent Labs  Lab 12/01/19 2007 12/02/19 0054 12/03/19 0318  WBC 7.6 8.3 8.8  RBC 5.03 4.73 4.55  HGB 15.5 14.9 14.1  HCT 45.0 43.5 40.8  MCV 89.5  92.0 89.7  MCH 30.8 31.5 31.0  MCHC 34.4 34.3 34.6  RDW 13.2 13.1 13.2  PLT 323 271 239    BNP Recent Labs  Lab 12/02/19 0622  BNP 158.5*     DDimer  Recent Labs  Lab 12/01/19 2105  DDIMER 0.79*     Radiology    CARDIAC CATHETERIZATION  Addendum Date: 12/02/2019    Acute inferior ST elevation MI commencing at around 12:00 noon.  Large globular thrombus mid RCA with distal embolization into the distal right coronary and also in the very distal portion of a large acute marginal branch.  Failure of balloon angioplasty assisted PCI to restore flow in the distal vessel.   Left coronary system is widely patent but with relatively diffuse intimal irregularity.  Faint left to right collaterals to the distal RCA beyond the PDA were noted.  Severe inferior wall hypokinesis with EF 40 to 45% with LVEDP 24 mmHg and consistent with acute combined systolic and diastolic heart failure. Recommendations:  Aspirin and Plavix  IV Aggrastat for 24 to 36 hours   Resume heparin in 4 to 6 hours at maintenance dose without bolus.  IV nitroglycerin.  Consider amlodipine therapy if blood pressure control is poor on IV nitroglycerin.  Plan restudy in 24 to 36 hours with the possibility of using rheolytic AngioJet or catheter-based thrombectomy if needed to treat the mid vessel.  PCI on the distal vessel if there is any distal flow and visualization.  Result Date: 12/01/2019  Acute inferior ST elevation MI commencing at around 12:00 noon.  Large globular thrombus mid RCA with distal embolization into the right coronary beyond the PDA origin and also in the very distal portion of the large acute marginal branch.  Failed angioplasty to restore flow in the distal vessel and in the ability to properly gauge the size of the vessels beyond the total occlusion.  Treatment of the mid right coronary was deferred for feel further embolization, choosing to treat with Aggrastat, P2 Y 12 therapy and heparin for additional 24 to 36 hours and then restudy the patient with definitive therapy on the mid vessel possibly using rheolytic AngioJet and if possible treatment of the distal vessel if there has been recanalization.  Left coronary system is widely patent and supplies faint collaterals to the distal RCA beyond the PDA.  Severe inferior wall hypokinesis with EF 40 to 45% and elevated EDP consistent with acute combined systolic and diastolic heart failure. Recommendations:  Aspirin and Plavix  IV Aggrastat for 24 to 36 hours  Resume heparin in 4 to 6 hours at maintenance dose without bolus.  IV nitroglycerin.  Consider amlodipine therapy if blood pressure control is poor on IV nitroglycerin.  Plan restudy in 24 to 36 hours with the possibility of using rheolytic AngioJet if needed to treat the mid vessel.  PCI on the distal vessel if there is any distal flow and vessel visualization to allow safe management in terms of balloon and stent sizing.  DG Chest Portable 1  View  Result Date: 12/01/2019 CLINICAL DATA:  39 year old male with chest pain. EXAM: PORTABLE CHEST 1 VIEW COMPARISON:  None. FINDINGS: The heart size and mediastinal contours are within normal limits. Both lungs are clear. The visualized skeletal structures are unremarkable. IMPRESSION: No active disease. Electronically Signed   By: Elgie Collard M.D.   On: 12/01/2019 20:08   ECHOCARDIOGRAM COMPLETE  Result Date: 12/02/2019    ECHOCARDIOGRAM REPORT   Patient Name:   CLANCY MULLARKEY  Date of Exam: 12/02/2019 Medical Rec #:  [id number]         Height:       74.0 in Accession #:    [id number]        Weight:       236.6 lb Date of Birth:  1980-12-09         BSA:          2.334 m Patient Age:    39 years          BP:           133/82 mmHg Patient Gender: M                 HR:           56 bpm. Exam Location:  Inpatient Procedure: 2D Echo Indications:    Stemi I21.3  History:        Patient has no prior history of Echocardiogram examinations.  Sonographer:    Thurman Coyer RDCS (AE) Referring Phys: [id number] AMANDA C CONIGLIO IMPRESSIONS  1. Left ventricular ejection fraction, by estimation, is 55 to 60%. The left ventricle has normal function. Left ventricular endocardial border not optimally defined to fully evaluate regional wall motion, however inferoseptum appears hypokinetic from apex to base. There is mild left ventricular hypertrophy. Left ventricular diastolic parameters were normal.  2. Right ventricular systolic function is normal. The right ventricular size is normal.  3. The mitral valve is normal in structure. No evidence of mitral valve regurgitation. No evidence of mitral stenosis.  4. The aortic valve is tricuspid. Aortic valve regurgitation is not visualized. No aortic stenosis is present.  5. The inferior vena cava is normal in size with greater than 50% respiratory variability, suggesting right atrial pressure of 3 mmHg. FINDINGS  Left Ventricle: Left ventricular ejection fraction, by  estimation, is 55 to 60%. The left ventricle has normal function. Left ventricular endocardial border not optimally defined to evaluate regional wall motion. The left ventricular internal cavity size was normal in size. There is mild left ventricular hypertrophy. Left ventricular diastolic parameters were normal.  LV Wall Scoring: The inferior septum is hypokinetic. Right Ventricle: The right ventricular size is normal. No increase in right ventricular wall thickness. Right ventricular systolic function is normal. Left Atrium: Left atrial size was normal in size. Right Atrium: Right atrial size was normal in size. Pericardium: There is no evidence of pericardial effusion. Mitral Valve: The mitral valve is normal in structure. No evidence of mitral valve regurgitation. No evidence of mitral valve stenosis. Tricuspid Valve: The tricuspid valve is normal in structure. Tricuspid valve regurgitation is not demonstrated. No evidence of tricuspid stenosis. Aortic Valve: The aortic valve is tricuspid. Aortic valve regurgitation is not visualized. No aortic stenosis is present. Pulmonic Valve: The pulmonic valve was normal in structure. Pulmonic valve regurgitation is not visualized. No evidence of pulmonic stenosis. Aorta: The aortic root is normal in size and structure. Venous: The inferior vena cava is normal in size with greater than 50% respiratory variability, suggesting right atrial pressure of 3 mmHg. IAS/Shunts: No atrial level shunt detected by color flow Doppler.  LEFT VENTRICLE PLAX 2D LVIDd:         4.40 cm  Diastology LVIDs:         3.20 cm  LV e' medial:    8.92 cm/s LV PW:         1.10 cm  LV E/e' medial:  7.7 LV IVS:  1.10 cm  LV e' lateral:   9.79 cm/s LVOT diam:     2.30 cm  LV E/e' lateral: 7.0 LV SV:         73 LV SV Index:   31 LVOT Area:     4.15 cm  RIGHT VENTRICLE RV S prime:     15.10 cm/s TAPSE (M-mode): 1.7 cm LEFT ATRIUM             Index       RIGHT ATRIUM           Index LA diam:         4.20 cm 1.80 cm/m  RA Area:     14.80 cm LA Vol (A2C):   36.4 ml 15.59 ml/m RA Volume:   37.10 ml  15.89 ml/m LA Vol (A4C):   41.4 ml 17.74 ml/m LA Biplane Vol: 40.9 ml 17.52 ml/m  AORTIC VALVE LVOT Vmax:   87.20 cm/s LVOT Vmean:  52.100 cm/s LVOT VTI:    0.175 m  AORTA Ao Root diam: 3.00 cm MITRAL VALVE MV Area (PHT): 2.29 cm    SHUNTS MV Decel Time: 331 msec    Systemic VTI:  0.18 m MV E velocity: 68.65 cm/s  Systemic Diam: 2.30 cm MV A velocity: 53.80 cm/s MV E/A ratio:  1.28 Gayatri Acharya MD Electronically signed by Gayatri Acharya MD Signature Date/Time: 12/02/2019/10:36:59 AM    Final     Cardiac Studies   Echocardiogram and cath reports as noted above  Patient Profile     39 y.o. male with no known prior heart history or risk factors, subsequently found to have hypertension, severe hyperlipidemia, and prediabetes admitted 12 hours into an inferior ST elevation MI and underwent mechanical revascularization using angioplasty but without successful reperfusion.  Hope is that there will be recanalization with pharmacologic therapy and he will subsequently have a large thrombotic plaque rupture treated 12/03/2019.  Assessment & Plan    1. Acute inferior ST elevation MI, will have repeat cath today, hopeful intervention on mid RCA, and also distal vessel if appropriate substrate.  May need AngioJet if still globular thrombus in mid RCA.  Peak troponin I 16,000. 2. Anticoagulation/antiplatelet regimen: Aggrastat and heparin will be discontinued.  Aspirin and Plavix will be continued post PCI. 3. Primary hypertension: Excellent blood pressure control on amlodipine.  Beta-blocker therapy on hold because of bradycardia. 4. Severe hyperlipidemia (possibly familial): LDL 187.  High intensity statin therapy  5. Acute on chronic combined systolic and diastolic heart failure: Echocardiogram suggest EF of 55% with inferior wall hypokinesis.  Beta-blockers have been on hold due to relative  bradycardia.  With normal LV function, beta-blocker therapy is not mandatory.  We will try to add low-dose beta-blocker therapy after successful PCI. 6. Diabetes mellitus type 2: We will initiate diabetes teaching post PCI.  For questions or updates, please contact [facility] Please consult [email or web address] for contact info under        Signed, Shalen Petrak W Wilbur Oakland III, MD  12/03/2019, 6:54 AM    

## 2019-12-03 NOTE — Progress Notes (Signed)
ANTICOAGULATION CONSULT NOTE  Pharmacy Consult for tirofiban Indication: chest pain/ACS  Assessment: 39 yom presenting with CP and started on heparin at [facility]. Code STEMI called and taken emergently to cath lab, found to have thrombus in RCA and distally occluded vessels - unable to stent. Patient received tirofiban and heparin until able to return to cath lab this morning. Patient underwent PCI and will receive 18 more hours of tirofiban per Cardiology.   Goal of Therapy:  Monitor platelets by anticoagulation protocol: Yes   Plan:  - Resume tirofiban at 0.15 mcg/kg/min for 18 hours  - Monitor s/sx of bleeding  Levada Schilling PharmD Candidate 2022 12/03/2019 10:43 AM

## 2019-12-04 ENCOUNTER — Encounter (HOSPITAL_COMMUNITY): Payer: Self-pay | Admitting: Interventional Cardiology

## 2019-12-04 DIAGNOSIS — E785 Hyperlipidemia, unspecified: Secondary | ICD-10-CM

## 2019-12-04 DIAGNOSIS — Z955 Presence of coronary angioplasty implant and graft: Secondary | ICD-10-CM

## 2019-12-04 DIAGNOSIS — I10 Essential (primary) hypertension: Secondary | ICD-10-CM

## 2019-12-04 LAB — BASIC METABOLIC PANEL
Anion gap: 9 (ref 5–15)
BUN: 7 mg/dL (ref 6–20)
CO2: 25 mmol/L (ref 22–32)
Calcium: 8.9 mg/dL (ref 8.9–10.3)
Chloride: 105 mmol/L (ref 98–111)
Creatinine, Ser: 1.14 mg/dL (ref 0.61–1.24)
GFR, Estimated: >60 mL/min (ref >60)
Glucose, Bld: 102 mg/dL — ABNORMAL HIGH (ref 70–99)
Potassium: 3.9 mmol/L (ref 3.5–5.1)
Sodium: 139 mmol/L (ref 135–145)

## 2019-12-04 LAB — CBC
HCT: 40.1 % (ref 39.0–52.0)
Hemoglobin: 13.6 g/dL (ref 13.0–17.0)
MCH: 31.1 pg (ref 26.0–34.0)
MCHC: 33.9 g/dL (ref 30.0–36.0)
MCV: 91.6 fL (ref 80.0–100.0)
Platelets: 212 10*3/uL (ref 150–400)
RBC: 4.38 MIL/uL (ref 4.22–5.81)
RDW: 13.4 % (ref 11.5–15.5)
WBC: 7.7 10*3/uL (ref 4.0–10.5)
nRBC: 0 % (ref 0.0–0.2)

## 2019-12-04 LAB — GLUCOSE, CAPILLARY
Glucose-Capillary: 104 mg/dL — ABNORMAL HIGH (ref 70–99)
Glucose-Capillary: 77 mg/dL (ref 70–99)

## 2019-12-04 MED ORDER — ASPIRIN 81 MG PO CHEW: 81.0000 mg | CHEWABLE_TABLET | Freq: Every day | ORAL | 0 refills | Status: AC

## 2019-12-04 MED ORDER — NITROGLYCERIN 0.4 MG SL SUBL: 0.4000 mg | SUBLINGUAL_TABLET | SUBLINGUAL | 2 refills | Status: AC | PRN

## 2019-12-04 MED ORDER — ATORVASTATIN CALCIUM 80 MG PO TABS: 80.0000 mg | ORAL_TABLET | Freq: Every day | ORAL | 0 refills | Status: AC

## 2019-12-04 MED ORDER — CLOPIDOGREL BISULFATE 75 MG PO TABS: 75.0000 mg | ORAL_TABLET | Freq: Every day | ORAL | 2 refills | Status: AC

## 2019-12-04 MED ORDER — AMLODIPINE BESYLATE 5 MG PO TABS: 5.0000 mg | ORAL_TABLET | Freq: Every day | ORAL | 0 refills | Status: AC

## 2019-12-04 NOTE — Discharge Summary (Addendum)
The patient has been seen in conjunction with Laverda Page. All aspects of care have been considered and discussed. The patient has been personally interviewed, examined, and all clinical data has been reviewed.   He is doing well.  He has ambulated without difficulty  Exam is unremarkable.  Plan DC today and f/u in 2-4 weeks.   We discussed RFM and management of his major comobidities: Htn, HL, and DM II.     Discharge Summary    Patient ID: Tyrone Carter MRN: [id number]; DOB: [birth year]  Admit date: 12/01/2019 Discharge date: 12/04/2019  Primary Care Provider: Patient, No Pcp Per  Primary Cardiologist: Lesleigh Noe, MD  Primary Electrophysiologist:  None   Discharge Diagnoses    Principal Problem:   STEMI (ST elevation myocardial infarction) [facility]) Active Problems:   Acute myocardial infarction [facility])   Status post coronary artery stent placement   Primary hypertension   Hyperlipidemia LDL goal <70   Diagnostic Studies/Procedures    Cath: 12/01/19   Acute inferior ST elevation MI commencing at around 12:00 noon.  Large globular thrombus mid RCA with distal embolization into the distal right coronary and also in the very distal portion of a large acute marginal branch.  Failure of balloon angioplasty assisted PCI to restore flow in the distal vessel.    Left coronary system is widely patent but with relatively diffuse intimal irregularity.  Faint left to right collaterals to the distal RCA beyond the PDA were noted.  Severe inferior wall hypokinesis with EF 40 to 45% with LVEDP 24 mmHg and consistent with acute combined systolic and diastolic heart failure.  Recommendations:   Aspirin and Plavix  IV Aggrastat for 24 to 36 hours  Resume heparin in 4 to 6 hours at maintenance dose without bolus.  IV nitroglycerin.  Consider amlodipine therapy if blood pressure control is poor on IV nitroglycerin.  Plan restudy in 24 to 36 hours with the  possibility of using rheolytic AngioJet or catheter-based thrombectomy if needed to treat the mid vessel.  PCI on the distal vessel if there is any distal flow and visualization.  Diagnostic Dominance: Right  Intervention   Echo: 12/02/19   IMPRESSIONS    1. Left ventricular ejection fraction, by estimation, is 55 to 60%. The  left ventricle has normal function. Left ventricular endocardial border  not optimally defined to fully evaluate regional wall motion, however  inferoseptum appears hypokinetic from  apex to base. There is mild left ventricular hypertrophy. Left ventricular  diastolic parameters were normal.  2. Right ventricular systolic function is normal. The right ventricular  size is normal.  3. The mitral valve is normal in structure. No evidence of mitral valve  regurgitation. No evidence of mitral stenosis.  4. The aortic valve is tricuspid. Aortic valve regurgitation is not  visualized. No aortic stenosis is present.  5. The inferior vena cava is normal in size with greater than 50%  respiratory variability, suggesting right atrial pressure of 3 mmHg.   Cath: 12/03/19   Prox RCA to Mid RCA lesion is 85% stenosed.  Dist RCA lesion is 99% stenosed with 99% stenosed side branch in RPAV.  Acute Mrg lesion is 99% stenosed.  Post intervention, there is a 0% residual stenosis.  A drug-eluting stent was successfully placed using a STENT RESOLUTE ONYX 4.0X15.   1. Successful PCI of the mid RCA with DES x 1 using IVUS guidance.  2. Unsuccessful aspiration thrombectomy of the PDA and PLOM but compared to  initial angiogram there has been some recanalization with improved flow.   Recommend: continue IV Aggrastat another 18 hours. DAPT for one year.   Diagnostic Dominance: Right  Intervention     _____________   History of Present Illness     Tyrone Carter is a 39 y.o. male with no known PMH noted. Tyrone Carter was in his usual state of health  until he was making lunch around noon and developed severe, sudden onset 10/10 chest pain. He laid down to rest with some improvement in his pain to ~5/10. However, his pain later worsened again to a 10/10 pressure like pain prompting him to go to the ER for evaluation. At the OSH, he was found to have inferior ST elevation on ECG with a significantly elevated troponin. The STEMI line was activated and he was transferred to [facility].  On arrival to [facility], he continued to have ongoing 5/10 pressure like chest pain in the center of his chest. He denied any shortness of breath, nausea, palpitations, lightheadedness, dizziness, orthopnea, PND, or lower leg swelling.  Hospital Course     1. Acute inferior ST elevation MI: presented with chest pain and acute ST elevation in inferior leads. Underwent cardiac cath with Dr. Katrinka Blazing unsuccessful balloon angioplasty of the mRCA, with distal embolization into the dRCA and very distal portion of OM. He was placed on aggrastat and IV heparin, along with ASA/plavix. Brought back for repeat cath on 11/17 with Dr. [place] with successful PCI of the [facility] with DESx1 with IVUS guidance. Unsuccessful aspiration of the PDA and PLOM but notable recanalization with improved flow. hsTn [place] at [address]. Was continued on aggrastat for an additional 18 hrs. No further chest pain post cath. Worked well with cardiac rehab.  -- continue ASA/plavix, statin and norvasc  2. Primary hypertension: Excellent blood pressure control on amlodipine.  No BB with baseline bradycardia  3. Severe hyperlipidemia (possibly familial): LDL 187.  High intensity statin therapy was started this admission.  -- FLP/LFTs in 8 weeks  4. Acute on chronic combined systolic and diastolic heart failure: Echocardiogram suggest EF of 55% with inferior wall hypokinesis. No issues with volume overload during admission.   5. Prediabetic: Hgb A1c 6.0. He actually has lost about 60 lbs and works out on a regular basis.  Instructed will need to follow up routinely with PCP for ongoing monitoring and need for medication initiation if remains elevated.   6. Polysubstance use: cessation advised.   Did the patient have an acute coronary syndrome (MI, NSTEMI, STEMI, etc) this admission?:  Yes                               AHA/ACC Clinical Performance & Quality Measures: 11. Aspirin prescribed? - Yes 12. ADP Receptor Inhibitor (Plavix/Clopidogrel, Brilinta/Ticagrelor or Effient/Prasugrel) prescribed (includes medically managed patients)? - Yes 13. Beta Blocker prescribed? - No - bradycardia 14. High Intensity Statin (Lipitor 40-80mg  or Crestor 20-40mg ) prescribed? - Yes 15. EF assessed during THIS hospitalization? - Yes 16. For EF <40%, was ACEI/ARB prescribed? - Not Applicable (EF >/= 40%) 17. For EF <40%, Aldosterone Antagonist (Spironolactone or Eplerenone) prescribed? - Not Applicable (EF >/= 40%) 18. Cardiac Rehab Phase II ordered (including medically managed patients)? - Yes       _____________  Discharge Vitals Blood pressure 125/89, pulse 67, temperature 98.2 F (36.8 C), temperature source Oral, resp. rate 18, height 6\' 2"  (1.88 m), weight 106.7 kg, SpO2 99 %.  Filed Weights   12/01/19 1943 12/03/19 0600 12/04/19 0508  Weight: 107.3 kg 106.7 kg 106.7 kg    Labs & Radiologic Studies    CBC Recent Labs    12/01/19 2007 12/02/19 0054 12/03/19 0318 12/04/19 0341  WBC 7.6   < > 8.8 7.7  NEUTROABS 5.6  --   --   --   HGB 15.5   < > 14.1 13.6  HCT 45.0   < > 40.8 40.1  MCV 89.5   < > 89.7 91.6  PLT 323   < > 239 212   < > = values in this interval not displayed.   Basic Metabolic Panel Recent Labs    16/10/96 0622 12/02/19 0622 12/03/19 1057 12/04/19 0341  NA 136  --   --  139  K 3.9  --   --  3.9  CL 103  --   --  105  CO2 24  --   --  25  GLUCOSE 134*  --   --  102*  BUN 7  --   --  7  CREATININE 1.10   < > 1.16 1.14  CALCIUM 9.0  --   --  8.9   < > = values in this interval  not displayed.   Liver Function Tests Recent Labs    12/01/19 2007  AST 68*  ALT 46*  ALKPHOS 49  BILITOT 0.5  PROT 8.3*  ALBUMIN 4.6   Recent Labs    12/01/19 2007  LIPASE 24   High Sensitivity Troponin:   Recent Labs  Lab 12/01/19 2007 12/02/19 0622 12/02/19 0947  TROPONINIHS 2,168* 14 16,108*    BNP Invalid input(s): POCBNP D-Dimer Recent Labs    12/01/19 2105  DDIMER 0.79*   Hemoglobin A1C Recent Labs    12/02/19 0054  HGBA1C 6.0*   Fasting Lipid Panel Recent Labs    12/02/19 0054  CHOL 272*  HDL 61  LDLCALC 187*  TRIG 121  CHOLHDL 4.5   Thyroid Function Tests No results for input(s): TSH, T4TOTAL, T3FREE, THYROIDAB in the last 72 hours.  Invalid input(s): FREET3 _____________  CARDIAC CATHETERIZATION  Result Date: 12/03/2019  Prox RCA to Mid RCA lesion is 85% stenosed.  Dist RCA lesion is 99% stenosed with 99% stenosed side branch in RPAV.  Acute Mrg lesion is 99% stenosed.  Post intervention, there is a 0% residual stenosis.  A drug-eluting stent was successfully placed using a STENT RESOLUTE ONYX 4.0X15.  1. Successful PCI of the mid RCA with DES x 1 using IVUS guidance. 2. Unsuccessful aspiration thrombectomy of the PDA and PLOM but compared to initial angiogram there has been some recanalization with improved flow. Recommend: continue IV Aggrastat another 18 hours. DAPT for one year.   CARDIAC CATHETERIZATION  Addendum Date: 12/02/2019    Acute inferior ST elevation MI commencing at around 12:00 noon.  Large globular thrombus mid RCA with distal embolization into the distal right coronary and also in the very distal portion of a large acute marginal branch.  Failure of balloon angioplasty assisted PCI to restore flow in the distal vessel.   Left coronary system is widely patent but with relatively diffuse intimal irregularity.  Faint left to right collaterals to the distal RCA beyond the PDA were noted.  Severe inferior wall  hypokinesis with EF 40 to 45% with LVEDP 24 mmHg and consistent with acute combined systolic and diastolic heart failure. Recommendations:  Aspirin and Plavix  IV Aggrastat for 24 to  36 hours  Resume heparin in 4 to 6 hours at maintenance dose without bolus.  IV nitroglycerin.  Consider amlodipine therapy if blood pressure control is poor on IV nitroglycerin.  Plan restudy in 24 to 36 hours with the possibility of using rheolytic AngioJet or catheter-based thrombectomy if needed to treat the mid vessel.  PCI on the distal vessel if there is any distal flow and visualization.  Result Date: 12/01/2019  Acute inferior ST elevation MI commencing at around 12:00 noon.  Large globular thrombus mid RCA with distal embolization into the right coronary beyond the PDA origin and also in the very distal portion of the large acute marginal branch.  Failed angioplasty to restore flow in the distal vessel and in the ability to properly gauge the size of the vessels beyond the total occlusion.  Treatment of the mid right coronary was deferred for feel further embolization, choosing to treat with Aggrastat, P2 Y 12 therapy and heparin for additional 24 to 36 hours and then restudy the patient with definitive therapy on the mid vessel possibly using rheolytic AngioJet and if possible treatment of the distal vessel if there has been recanalization.  Left coronary system is widely patent and supplies faint collaterals to the distal RCA beyond the PDA.  Severe inferior wall hypokinesis with EF 40 to 45% and elevated EDP consistent with acute combined systolic and diastolic heart failure. Recommendations:  Aspirin and Plavix  IV Aggrastat for 24 to 36 hours  Resume heparin in 4 to 6 hours at maintenance dose without bolus.  IV nitroglycerin.  Consider amlodipine therapy if blood pressure control is poor on IV nitroglycerin.  Plan restudy in 24 to 36 hours with the possibility of using rheolytic AngioJet if needed to  treat the mid vessel.  PCI on the distal vessel if there is any distal flow and vessel visualization to allow safe management in terms of balloon and stent sizing.  DG Chest Portable 1 View  Result Date: 12/01/2019 CLINICAL DATA:  39 year old male with chest pain. EXAM: PORTABLE CHEST 1 VIEW COMPARISON:  None. FINDINGS: The heart size and mediastinal contours are within normal limits. Both lungs are clear. The visualized skeletal structures are unremarkable. IMPRESSION: No active disease. Electronically Signed   By: Elgie Collard M.D.   On: 12/01/2019 20:08   ECHOCARDIOGRAM COMPLETE  Result Date: 12/02/2019    ECHOCARDIOGRAM REPORT   Patient Name:   Tyrone Carter Date of Exam: 12/02/2019 Medical Rec #:  [id number]         Height:       74.0 in Accession #:    [id number]        Weight:       236.6 lb Date of Birth:  December 09, 1980         BSA:          2.334 m Patient Age:    39 years          BP:           133/82 mmHg Patient Gender: M                 HR:           56 bpm. Exam Location:  Inpatient Procedure: 2D Echo Indications:    Stemi I21.3  History:        Patient has no prior history of Echocardiogram examinations.  Sonographer:    Thurman Coyer RDCS (AE) Referring Phys: [phone] AMANDA C CONIGLIO IMPRESSIONS  1. Left  ventricular ejection fraction, by estimation, is 55 to 60%. The left ventricle has normal function. Left ventricular endocardial border not optimally defined to fully evaluate regional wall motion, however inferoseptum appears hypokinetic from apex to base. There is mild left ventricular hypertrophy. Left ventricular diastolic parameters were normal.  2. Right ventricular systolic function is normal. The right ventricular size is normal.  3. The mitral valve is normal in structure. No evidence of mitral valve regurgitation. No evidence of mitral stenosis.  4. The aortic valve is tricuspid. Aortic valve regurgitation is not visualized. No aortic stenosis is present.  5. The inferior  vena cava is normal in size with greater than 50% respiratory variability, suggesting right atrial pressure of 3 mmHg. FINDINGS  Left Ventricle: Left ventricular ejection fraction, by estimation, is 55 to 60%. The left ventricle has normal function. Left ventricular endocardial border not optimally defined to evaluate regional wall motion. The left ventricular internal cavity size was normal in size. There is mild left ventricular hypertrophy. Left ventricular diastolic parameters were normal.  LV Wall Scoring: The inferior septum is hypokinetic. Right Ventricle: The right ventricular size is normal. No increase in right ventricular wall thickness. Right ventricular systolic function is normal. Left Atrium: Left atrial size was normal in size. Right Atrium: Right atrial size was normal in size. Pericardium: There is no evidence of pericardial effusion. Mitral Valve: The mitral valve is normal in structure. No evidence of mitral valve regurgitation. No evidence of mitral valve stenosis. Tricuspid Valve: The tricuspid valve is normal in structure. Tricuspid valve regurgitation is not demonstrated. No evidence of tricuspid stenosis. Aortic Valve: The aortic valve is tricuspid. Aortic valve regurgitation is not visualized. No aortic stenosis is present. Pulmonic Valve: The pulmonic valve was normal in structure. Pulmonic valve regurgitation is not visualized. No evidence of pulmonic stenosis. Aorta: The aortic root is normal in size and structure. Venous: The inferior vena cava is normal in size with greater than 50% respiratory variability, suggesting right atrial pressure of 3 mmHg. IAS/Shunts: No atrial level shunt detected by color flow Doppler.  LEFT VENTRICLE PLAX 2D LVIDd:         4.40 cm  Diastology LVIDs:         3.20 cm  LV e' medial:    8.92 cm/s LV PW:         1.10 cm  LV E/e' medial:  7.7 LV IVS:        1.10 cm  LV e' lateral:   9.79 cm/s LVOT diam:     2.30 cm  LV E/e' lateral: 7.0 LV SV:         73 LV SV  Index:   31 LVOT Area:     4.15 cm  RIGHT VENTRICLE RV S prime:     15.10 cm/s TAPSE (M-mode): 1.7 cm LEFT ATRIUM             Index       RIGHT ATRIUM           Index LA diam:        4.20 cm 1.80 cm/m  RA Area:     14.80 cm LA Vol (A2C):   36.4 ml 15.59 ml/m RA Volume:   37.10 ml  15.89 ml/m LA Vol (A4C):   41.4 ml 17.74 ml/m LA Biplane Vol: 40.9 ml 17.52 ml/m  AORTIC VALVE LVOT Vmax:   87.20 cm/s LVOT Vmean:  52.100 cm/s LVOT VTI:    0.175 m  AORTA Ao Root  diam: 3.00 cm MITRAL VALVE MV Area (PHT): 2.29 cm    SHUNTS MV Decel Time: 331 msec    Systemic VTI:  0.18 m MV E velocity: 68.65 cm/s  Systemic Diam: 2.30 cm MV A velocity: 53.80 cm/s MV E/A ratio:  1.28 Weston Brass MD Electronically signed by Weston Brass MD Signature Date/Time: 12/02/2019/10:36:59 AM    Final    Disposition   Pt is being discharged home today in good condition.  Follow-up Plans & Appointments     Follow-up Information    Rosalio Macadamia, NP Follow up on 12/17/2019.   Specialties: Nurse Practitioner, Interventional Cardiology, Cardiology, Radiology Why: at 3:15pm for your follow up appt.  Contact information: [address] [place] [address] [phone]              Discharge Instructions    Amb Referral to Cardiac Rehabilitation   Complete by: As directed    Diagnosis:  STEMI Coronary Stents     After initial evaluation and assessments completed: Virtual Based Care may be provided alone or in conjunction with Phase 2 Cardiac Rehab based on patient barriers.: Yes   Diet - low sodium heart healthy   Complete by: As directed    Discharge instructions   Complete by: As directed    Radial Site Care Refer to this sheet in the next few weeks. These instructions provide you with information on caring for yourself after your procedure. Your caregiver may also give you more specific instructions. Your treatment has been planned according to current medical practices, but problems  sometimes occur. Call your caregiver if you have any problems or questions after your procedure. HOME CARE INSTRUCTIONS You may shower the day after the procedure.Remove the bandage (dressing) and gently wash the site with plain soap and water.Gently pat the site dry.  Do not apply powder or lotion to the site.  Do not submerge the affected site in water for 3 to 5 days.  Inspect the site at least twice daily.  Do not flex or bend the affected arm for 24 hours.  No lifting over 5 pounds (2.3 kg) for 5 days after your procedure.  Do not drive home if you are discharged the same day of the procedure. Have someone else drive you.  You may drive 24 hours after the procedure unless otherwise instructed by your caregiver.  What to expect: Any bruising will usually fade within 1 to 2 weeks.  Blood that collects in the tissue (hematoma) may be painful to the touch. It should usually decrease in size and tenderness within 1 to 2 weeks.  SEEK IMMEDIATE MEDICAL CARE IF: You have unusual pain at the radial site.  You have redness, warmth, swelling, or pain at the radial site.  You have drainage (other than a small amount of blood on the dressing).  You have chills.  You have a fever or persistent symptoms for more than 72 hours.  You have a fever and your symptoms suddenly get worse.  Your arm becomes pale, cool, tingly, or numb.  You have heavy bleeding from the site. Hold pressure on the site.   PLEASE DO NOT MISS ANY DOSES OF YOUR PLAVIX!!!!! Also keep a log of you blood pressures and bring back to your follow up appt. Please call the office with any questions.   Patients taking blood thinners should generally stay away from medicines like ibuprofen, Advil, Motrin, naproxen, and Aleve due to risk of stomach bleeding. You  may take Tylenol as directed or talk to your primary doctor about alternatives.  PLEASE ENSURE THAT YOU DO NOT RUN OUT OF YOUR PLAVIX. This medication is very important to  remain on for at least one year. IF you have issues obtaining this medication due to cost please CALL the office 3-5 business days prior to running out in order to prevent missing doses of this medication.   Increase activity slowly   Complete by: As directed       Discharge Medications   Allergies as of 12/04/2019   No Known Allergies     Medication List    TAKE these medications   amLODipine 5 MG tablet Commonly known as: NORVASC Take 1 tablet (5 mg total) by mouth daily. Start taking on: December 05, 2019   aspirin 81 MG chewable tablet Chew 1 tablet (81 mg total) by mouth daily. Start taking on: December 05, 2019   atorvastatin 80 MG tablet Commonly known as: LIPITOR Take 1 tablet (80 mg total) by mouth daily. Start taking on: December 05, 2019   clopidogrel 75 MG tablet Commonly known as: PLAVIX Take 1 tablet (75 mg total) by mouth daily with breakfast. Start taking on: December 05, 2019   nitroGLYCERIN 0.4 MG SL tablet Commonly known as: Nitrostat Place 1 tablet (0.4 mg total) under the tongue every 5 (five) minutes as needed.        Outstanding Labs/Studies   FLP/LFTs in 8 weeks  Duration of Discharge Encounter   Greater than 30 minutes including physician time.  Signed, Laverda Page, NP 12/04/2019, 12:46 PM

## 2019-12-04 NOTE — Progress Notes (Signed)
CARDIOLOGY OFFICE NOTE  Date:  12/17/2019    Tyrone Carter Date of Birth: [birth year] Medical Record #[id number]  PCP:  Patient, No Pcp Per  Cardiologist:  Katrinka Blazing (NEW)    Chief Complaint  Patient presents with  . Follow-up  . Hospitalization Follow-up    Seen for Dr. Katrinka Blazing (NEW)    History of Present Illness: Tyrone Carter is a 39 y.o. male who presents today for a post hospital visit. Seen for Dr. Katrinka Blazing (NEW).   He had no prior PMH.   Presented earlier in November with chest pain. Came to ER. EKG with inferior ST elevation - sent to [facility] - referred for urgent cath.   Comes in today. Here alone. He is doing well. No chest pain. Breathing is good. He is walking. 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 Not dizzy. BP is good - he showed me his readings off his phone - they are great. Tolerating his medicines. Does not smoke and denies drug use. He has been trying to actively lose weight - was almost 300#. His A1C was 6 - he was not aware of this. He feels like he is doing well.   Past Medical History:  Diagnosis Date  . Hyperlipidemia   . Hypertension   . Prediabetes   . STEMI (ST elevation myocardial infarction) [facility])     Past Surgical History:  Procedure Laterality Date  . CORONARY BALLOON ANGIOPLASTY N/A 12/01/2019   Procedure: CORONARY BALLOON ANGIOPLASTY;  Surgeon: Lyn Records, MD;  Location: [facility] INVASIVE CV LAB;  Service: Cardiovascular;  Laterality: N/A;  . CORONARY STENT INTERVENTION N/A 12/03/2019   Procedure: CORONARY STENT INTERVENTION;  Surgeon: Swaziland, Peter M, MD;  Location: [facility] INVASIVE CV LAB;  Service: Cardiovascular;  Laterality: N/A;  . CORONARY/GRAFT ACUTE MI REVASCULARIZATION N/A 12/01/2019   Procedure: Coronary/Graft Acute MI Revascularization;  Surgeon: Lyn Records, MD;  Location: [facility] INVASIVE CV LAB;  Service: Cardiovascular;  Laterality: N/A;  . LEFT HEART CATH  AND CORONARY ANGIOGRAPHY N/A 12/01/2019   Procedure: LEFT HEART CATH AND CORONARY ANGIOGRAPHY;  Surgeon: Lyn Records, MD;  Location: [facility] INVASIVE CV LAB;  Service: Cardiovascular;  Laterality: N/A;     Medications: Current Meds  Medication Sig  . amLODipine (NORVASC) 5 MG tablet Take 1 tablet (5 mg total) by mouth daily.  Marland Kitchen aspirin 81 MG chewable tablet Chew 1 tablet (81 mg total) by mouth daily.  Marland Kitchen atorvastatin (LIPITOR) 80 MG tablet Take 1 tablet (80 mg total) by mouth daily.  . clopidogrel (PLAVIX) 75 MG tablet Take 1 tablet (75 mg total) by mouth daily with breakfast.  . nitroGLYCERIN (NITROSTAT) 0.4 MG SL tablet Place 1 tablet (0.4 mg total) under the tongue every 5 (five) minutes as needed.     Allergies: No Known Allergies  Social History: The patient  reports that he has never smoked. He has never used smokeless tobacco. He reports current alcohol use. He reports that he does not use drugs.   Family History: The patient's family history is not on file. Mom died when he was 11 from cancer. Does not know about his father but he does have a PPM - did lots of recreational drugs.   Review of Systems: Please see the history of present illness.   All other systems are reviewed and negative.   Physical Exam: VS:  BP 120/90   Pulse 93  Ht 6\' 2"  (1.88 m)   Wt 243 lb 6.4 oz (110.4 kg)   SpO2 97%   BMI 31.25 kg/m  .  BMI Body mass index is 31.25 kg/m.  Wt Readings from Last 3 Encounters:  12/17/19 243 lb 6.4 oz (110.4 kg)  12/04/19 235 lb 3.2 oz (106.7 kg)    General: Pleasant. He is of large stature. He is alert and in no acute distress.   Cardiac: Regular rate and rhythm. HR 84 by my count. No murmurs, rubs, or gallops. No edema.  Respiratory:  Lungs are clear to auscultation bilaterally with normal work of breathing.  GI: Soft and nontender.  MS: No deformity or atrophy. Gait and ROM intact.  Skin: Warm and dry. Color is normal.  Neuro:  Strength and sensation are  intact and no gross focal deficits noted.  Psych: Alert, appropriate and with normal affect.   LABORATORY DATA:  EKG:  EKG is not ordered today.   Lab Results  Component Value Date   WBC 7.7 12/04/2019   HGB 13.6 12/04/2019   HCT 40.1 12/04/2019   PLT 212 12/04/2019   GLUCOSE 102 (H) 12/04/2019   CHOL 272 (H) 12/02/2019   TRIG 121 12/02/2019   HDL 61 12/02/2019   LDLCALC 187 (H) 12/02/2019   ALT 46 (H) 12/01/2019   AST 68 (H) 12/01/2019   NA 139 12/04/2019   K 3.9 12/04/2019   CL 105 12/04/2019   CREATININE 1.14 12/04/2019   BUN 7 12/04/2019   CO2 25 12/04/2019   HGBA1C 6.0 (H) 12/02/2019     BNP (last 3 results) Recent Labs    12/02/19 0622  BNP 158.5*    ProBNP (last 3 results) No results for input(s): PROBNP in the last 8760 hours.   Other Studies Reviewed Today:  Cath: 12/01/19   Acute inferior ST elevation MI commencing at around 12:00 noon.  Large globular thrombus mid RCA with distal embolization into the distal right coronary and also in the very distal portion of a large acute marginal branch.  Failure of balloon angioplasty assisted PCI to restore flow in the distal vessel.   Left coronary system is widely patent but with relatively diffuse intimal irregularity. Faint left to right collaterals to the distal RCA beyond the PDA were noted.  Severe inferior wall hypokinesis with EF 40 to 45% with LVEDP 24 mmHg and consistent with acute combined systolic and diastolic heart failure.  Recommendations:   Aspirin and Plavix  IV Aggrastat for 24 to 36 hours  Resume heparin in 4 to 6 hours at maintenance dose without bolus.  IV nitroglycerin.  Consider amlodipine therapy if blood pressure control is poor on IV nitroglycerin.  Plan restudy in 24 to 36 hours with the possibility of using rheolytic AngioJet or catheter-based thrombectomy if needed to treat the mid vessel. PCI on the distal vessel if there is any distal flow and  visualization.  Diagnostic Dominance: Right  Intervention   Echo: 12/02/19   IMPRESSIONS    1. Left ventricular ejection fraction, by estimation, is 55 to 60%. The  left ventricle has normal function. Left ventricular endocardial border  not optimally defined to fully evaluate regional wall motion, however  inferoseptum appears hypokinetic from  apex to base. There is mild left ventricular hypertrophy. Left ventricular  diastolic parameters were normal.  2. Right ventricular systolic function is normal. The right ventricular  size is normal.  3. The mitral valve is normal in structure. No evidence of mitral  valve  regurgitation. No evidence of mitral stenosis.  4. The aortic valve is tricuspid. Aortic valve regurgitation is not  visualized. No aortic stenosis is present.  5. The inferior vena cava is normal in size with greater than 50%  respiratory variability, suggesting right atrial pressure of 3 mmHg.   Cath: 12/03/19   Prox RCA to Mid RCA lesion is 85% stenosed.  Dist RCA lesion is 99% stenosed with 99% stenosed side branch in RPAV.  Acute Mrg lesion is 99% stenosed.  Post intervention, there is a 0% residual stenosis.  A drug-eluting stent was successfully placed using a STENT RESOLUTE ONYX 4.0X15.  1. Successful PCI of the mid RCA with DES x 1 using IVUS guidance.  2. Unsuccessful aspiration thrombectomy of the PDA and PLOM but compared to initial angiogram there has been some recanalization with improved flow.   Recommend: continue IV Aggrastat another 18 hours. DAPT for one year.   Diagnostic Dominance: Right  Intervention     _____________     Assessment/Plan:  1. Prior inferior STEMI - had initial cath and unsuccessful balloon angioplasty of the mRCA - distal embolization into the dRCA and very distal portion of the OM. Treated with Aggrastat and Heparin - repeat cath with successful PCI/DES of the mRCA with IVUS. Did have  unsuccessful aspiration of the PD an PLOM but notable recanalization with improved flow. EF was normal by Echo. He is progressing well. Needs aggressive CV risk factor modification. Recheck lab today. Did not have baseline beta blocker due to baseline bradycardia.   2. HTN - BP good at home - no changes made.   3. HLD - possibly familial - on high dose statin.   4. Elevated LFTs - recheck today.   5. Obesity - he is actively losing weight.   6. 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 Echo with EF 55%.  7. DM - he will continue with efforts at diet/weight loss.   Current medicines are reviewed with the patient today.  The patient does not have concerns regarding medicines other than what has been noted above.  The following changes have been made:  See above.  Labs/ tests ordered today include:    Orders Placed This Encounter  Procedures  . Basic metabolic panel  . CBC  . Hepatic function panel     Disposition:   FU with Dr. Katrinka Blazing in 6 to 8 weeks with fasting labs. Overall, felt to be doing well. Ok to gradually resume his activities.     Patient is agreeable to this plan and will call if any problems develop in the interim.   SignedNorma Fredrickson, NP  12/17/2019 3:45 PM  [facility] [address] [place], [place]  [address] Phone: [phone] Fax: [phone]

## 2019-12-04 NOTE — Progress Notes (Signed)
CARDIAC REHAB PHASE I   Offered to walk with pt, pt states he has been ambulating independently without difficulty. Denies pain, or SOB. Pt educated on importance of ASA, Plavix, and statin. Given MI book along with heart healthy and diabetic diets. Reviewed site care, restrictions, and exercise guidelines. Will refer to CRP II [place].  [id number] Reynold Bowen, RN BSN 12/04/2019 8:36 AM

## 2019-12-17 ENCOUNTER — Encounter: Payer: Self-pay | Admitting: Nurse Practitioner

## 2019-12-17 ENCOUNTER — Ambulatory Visit (INDEPENDENT_AMBULATORY_CARE_PROVIDER_SITE_OTHER): Payer: Self-pay | Admitting: Nurse Practitioner

## 2019-12-17 ENCOUNTER — Other Ambulatory Visit: Payer: Self-pay

## 2019-12-17 VITALS — BP 120/90 | HR 93 | Ht 74.0 in | Wt 243.4 lb

## 2019-12-17 DIAGNOSIS — I10 Essential (primary) hypertension: Secondary | ICD-10-CM

## 2019-12-17 DIAGNOSIS — I259 Chronic ischemic heart disease, unspecified: Secondary | ICD-10-CM

## 2019-12-17 DIAGNOSIS — E7849 Other hyperlipidemia: Secondary | ICD-10-CM

## 2019-12-17 NOTE — Patient Instructions (Addendum)
After Visit Summary:  We will be checking the following labs today - BMET, CBC, HPF   Medication Instructions:    Continue with your current medicines.    If you need a refill on your cardiac medications before your next appointment, please call your pharmacy.     Testing/Procedures To Be Arranged:  N/A  Follow-Up:   See Dr. Katrinka Blazing in about 6 to 8 weeks with fasting labs.     At [facility], you and your health needs are our priority.  As part of our continuing mission to provide you with exceptional heart care, we have created designated Provider Care Teams.  These Care Teams include your primary Cardiologist (physician) and Advanced Practice Providers (APPs -  Physician Assistants and Nurse Practitioners) who all work together to provide you with the care you need, when you need it.  Special Instructions:  . Stay safe, wash your hands for at least 20 seconds and wear a mask when needed.  . It was good to talk with you today.  Rip Harbour to gradually increase your activities    Call the [facility] office at [phone] if you have any questions, problems or concerns.

## 2019-12-18 ENCOUNTER — Telehealth: Payer: Self-pay | Admitting: *Deleted

## 2019-12-18 DIAGNOSIS — Z79899 Other long term (current) drug therapy: Secondary | ICD-10-CM

## 2019-12-18 LAB — BASIC METABOLIC PANEL
BUN/Creatinine Ratio: 13 (ref 9–20)
BUN: 16 mg/dL (ref 6–20)
CO2: 26 mmol/L (ref 20–29)
Calcium: 9.9 mg/dL (ref 8.7–10.2)
Chloride: 101 mmol/L (ref 96–106)
Creatinine, Ser: 1.2 mg/dL (ref 0.76–1.27)
GFR calc Af Amer: 89 mL/min/{1.73_m2} (ref >59)
GFR calc non Af Amer: 77 mL/min/{1.73_m2} (ref >59)
Glucose: 96 mg/dL (ref 65–99)
Potassium: 4.6 mmol/L (ref 3.5–5.2)
Sodium: 140 mmol/L (ref 134–144)

## 2019-12-18 LAB — CBC
Hematocrit: 43.7 % (ref 37.5–51.0)
Hemoglobin: 14.9 g/dL (ref 13.0–17.7)
MCH: 30.5 pg (ref 26.6–33.0)
MCHC: 34.1 g/dL (ref 31.5–35.7)
MCV: 90 fL (ref 79–97)
Platelets: 543 10*3/uL — ABNORMAL HIGH (ref 150–450)
RBC: 4.88 x10E6/uL (ref 4.14–5.80)
RDW: 13.1 % (ref 11.6–15.4)
WBC: 6.7 10*3/uL (ref 3.4–10.8)

## 2019-12-18 LAB — HEPATIC FUNCTION PANEL
ALT: 49 IU/L — ABNORMAL HIGH (ref 0–44)
AST: 25 IU/L (ref 0–40)
Albumin: 4.8 g/dL (ref 4.0–5.0)
Alkaline Phosphatase: 73 IU/L (ref 44–121)
Bilirubin Total: 0.2 mg/dL (ref 0.0–1.2)
Bilirubin, Direct: <0.1 mg/dL (ref 0.00–0.40)
Total Protein: 7.6 g/dL (ref 6.0–8.5)

## 2019-12-18 NOTE — Addendum Note (Signed)
Addended by: Burnetta Sabin on: 12/18/2019 11:39 AM   Modules accepted: Orders

## 2019-12-18 NOTE — Telephone Encounter (Signed)
-----   Message from Rosalio Macadamia, NP sent at 12/18/2019  7:42 AM EST ----- Ok to report. Labs are stable - platelets a little high - needs repeat CBC added to next visit - LFTs improved - would continue on current regimen.

## 2019-12-30 ENCOUNTER — Telehealth (HOSPITAL_COMMUNITY): Payer: Self-pay

## 2019-12-30 NOTE — Telephone Encounter (Signed)
Called patient to see if he was interested in participating in the Cardiac Rehab Program. Patient stated yes. Patient will come in for orientation on 01/27/20 @ 8AM and will attend the VCR program only.  Pensions consultant.

## 2020-01-01 ENCOUNTER — Inpatient Hospital Stay: Payer: Self-pay | Admitting: Internal Medicine

## 2020-01-22 ENCOUNTER — Telehealth (HOSPITAL_COMMUNITY): Payer: Self-pay | Admitting: *Deleted

## 2020-01-22 NOTE — Telephone Encounter (Signed)
Left message to call cardiac rehab regarding upcoming orientation appointment.Gladstone Lighter, RN,BSN 01/22/2020 10:18 AM

## 2020-01-23 ENCOUNTER — Telehealth (HOSPITAL_COMMUNITY): Payer: Self-pay | Admitting: Pharmacist

## 2020-01-26 NOTE — Telephone Encounter (Signed)
Cardiac Rehab - Pharmacy Resident Documentation   Patient unable to be reached after three call attempts. Please complete allergy verification and medication review during patient's cardiac rehab appointment.    Laverna Peace, PharmD PGY-1 Ambulatory Care Pharmacy Resident 01/26/2020 10:28 AM

## 2020-01-27 ENCOUNTER — Inpatient Hospital Stay (HOSPITAL_COMMUNITY): Admission: RE | Admit: 2020-01-27 | Payer: Self-pay | Source: Ambulatory Visit

## 2020-01-29 NOTE — Progress Notes (Deleted)
Cardiology Office Note:    Date:  01/29/2020   ID:  Tyrone Carter, DOB 15-Aug-1980, MRN [id number]  PCP:  Patient, No Pcp Per  Cardiologist:  Lesleigh Noe, MD   Referring MD: No ref. provider found   No chief complaint on file.   History of Present Illness:    Tyrone Carter is a 40 y.o. male with a hx of STEMI 11/2019, hyperlipidemia, hypertension, DM II  Past Medical History:  Diagnosis Date  . Hyperlipidemia   . Hypertension   . Prediabetes   . STEMI (ST elevation myocardial infarction) [facility])     Past Surgical History:  Procedure Laterality Date  . CORONARY BALLOON ANGIOPLASTY N/A 12/01/2019   Procedure: CORONARY BALLOON ANGIOPLASTY;  Surgeon: Lyn Records, MD;  Location: [facility] INVASIVE CV LAB;  Service: Cardiovascular;  Laterality: N/A;  . CORONARY STENT INTERVENTION N/A 12/03/2019   Procedure: CORONARY STENT INTERVENTION;  Surgeon: Swaziland, Peter M, MD;  Location: [facility] INVASIVE CV LAB;  Service: Cardiovascular;  Laterality: N/A;  . CORONARY/GRAFT ACUTE MI REVASCULARIZATION N/A 12/01/2019   Procedure: Coronary/Graft Acute MI Revascularization;  Surgeon: Lyn Records, MD;  Location: [facility] INVASIVE CV LAB;  Service: Cardiovascular;  Laterality: N/A;  . LEFT HEART CATH AND CORONARY ANGIOGRAPHY N/A 12/01/2019   Procedure: LEFT HEART CATH AND CORONARY ANGIOGRAPHY;  Surgeon: Lyn Records, MD;  Location: [facility] INVASIVE CV LAB;  Service: Cardiovascular;  Laterality: N/A;    Current Medications: No outpatient medications have been marked as taking for the 01/30/20 encounter (Appointment) with Lyn Records, MD.     Allergies:   Patient has no known allergies.   Social History   Socioeconomic History  . Marital status: Divorced    Spouse name: Not on file  . Number of children: Not on file  . Years of education: Not on file  . Highest education level: Not on file  Occupational History  . Not on file  Tobacco Use  . Smoking status: Never Smoker  . Smokeless  tobacco: Never Used  Substance and Sexual Activity  . Alcohol use: Yes  . Drug use: Never  . Sexual activity: Not on file  Other Topics Concern  . Not on file  Social History Narrative  . Not on file   Social Determinants of Health   Financial Resource Strain: Not on file  Food Insecurity: Not on file  Transportation Needs: Not on file  Physical Activity: Not on file  Stress: Not on file  Social Connections: Not on file     Family History: The patient's family history is not on file.  ROS:   Please see the history of present illness.    *** All other systems reviewed and are negative.  EKGs/Labs/Other Studies Reviewed:    The following studies were reviewed today: 12/03/2019: Diagnostic Dominance: Right    Intervention     ECHO 11/2019 IMPRESSIONS    1. Left ventricular ejection fraction, by estimation, is 55 to 60%. The  left ventricle has normal function. Left ventricular endocardial border  not optimally defined to fully evaluate regional wall motion, however  inferoseptum appears hypokinetic from  apex to base. There is mild left ventricular hypertrophy. Left ventricular  diastolic parameters were normal.  2. Right ventricular systolic function is normal. The right ventricular  size is normal.  3. The mitral valve is normal in structure. No evidence of mitral valve  regurgitation. No evidence of mitral stenosis.  4. The aortic valve is tricuspid. Aortic valve regurgitation  is not  visualized. No aortic stenosis is present.  5. The inferior vena cava is normal in size with greater than 50%  respiratory variability, suggesting right atrial pressure of 3 mmHg.     EKG:  EKG ***  Recent Labs: 12/02/2019: B Natriuretic Peptide 158.5 12/17/2019: ALT 49; BUN 16; Creatinine, Ser 1.20; Hemoglobin 14.9; Platelets 543; Potassium 4.6; Sodium 140  Recent Lipid Panel    Component Value Date/Time   CHOL 272 (H) 12/02/2019 0054   TRIG 121 12/02/2019 0054    HDL 61 12/02/2019 0054   CHOLHDL 4.5 12/02/2019 0054   VLDL 24 12/02/2019 0054   LDLCALC 187 (H) 12/02/2019 0054    Physical Exam:    VS:  There were no vitals taken for this visit.    Wt Readings from Last 3 Encounters:  12/17/19 243 lb 6.4 oz (110.4 kg)  12/04/19 235 lb 3.2 oz (106.7 kg)     GEN: ***. No acute distress HEENT: Normal NECK: No JVD. LYMPHATICS: No lymphadenopathy CARDIAC: *** murmur. RRR *** gallop, or edema. VASCULAR: *** Normal Pulses. No bruits. RESPIRATORY:  Clear to auscultation without rales, wheezing or rhonchi  ABDOMEN: Soft, non-tender, non-distended, No pulsatile mass, MUSCULOSKELETAL: No deformity  SKIN: Warm and dry NEUROLOGIC:  Alert and oriented x 3 PSYCHIATRIC:  Normal affect   ASSESSMENT:    1. Acute ST elevation myocardial infarction (STEMI) involving right coronary artery (HCC)   2. Primary hypertension   3. Hyperlipidemia LDL goal <70   4. Educated about COVID-19 virus infection    PLAN:    In order of problems listed above:  1. ***   Medication Adjustments/Labs and Tests Ordered: Current medicines are reviewed at length with the patient today.  Concerns regarding medicines are outlined above.  No orders of the defined types were placed in this encounter.  No orders of the defined types were placed in this encounter.   There are no Patient Instructions on file for this visit.   Signed, Lesleigh Noe, MD  01/29/2020 6:38 PM    [facility]

## 2020-01-30 ENCOUNTER — Ambulatory Visit: Payer: Self-pay | Admitting: Interventional Cardiology

## 2020-01-30 DIAGNOSIS — E785 Hyperlipidemia, unspecified: Secondary | ICD-10-CM

## 2020-01-30 DIAGNOSIS — Z7189 Other specified counseling: Secondary | ICD-10-CM

## 2020-01-30 DIAGNOSIS — I2111 ST elevation (STEMI) myocardial infarction involving right coronary artery: Secondary | ICD-10-CM

## 2020-01-30 DIAGNOSIS — I10 Essential (primary) hypertension: Secondary | ICD-10-CM

## 2020-02-03 ENCOUNTER — Telehealth (HOSPITAL_COMMUNITY): Payer: Self-pay | Admitting: *Deleted

## 2020-02-03 NOTE — Telephone Encounter (Signed)
Called patient to reschedule cardiac rehab orientation. No answer, message left on voicemail. Mailed please contact letter.

## 2020-02-24 ENCOUNTER — Telehealth (HOSPITAL_COMMUNITY): Payer: Self-pay

## 2020-02-24 NOTE — Telephone Encounter (Signed)
No response from pt.  Closed referral  

## 2020-07-14 ENCOUNTER — Inpatient Hospital Stay
Admit: 2020-07-14 | Discharge: 2020-07-14 | Disposition: A | Discharge status: Other Acute Inpatient Hospital | Attending: Emergency Medicine

## 2020-07-14 ENCOUNTER — Inpatient Hospital Stay: Admit: 2020-07-14 | Discharge: 2020-07-20 | Primary: Sports Medicine

## 2020-07-14 ENCOUNTER — Inpatient Hospital Stay: Admit: 2020-07-14 | Primary: Sports Medicine

## 2020-07-14 ENCOUNTER — Inpatient Hospital Stay
Admit: 2020-07-14 | Discharge: 2020-07-15 | Disposition: A | Discharge status: Home / Self Care | Attending: Cardiovascular Disease | Admitting: Cardiovascular Disease

## 2020-07-14 DIAGNOSIS — T82867A Thrombosis of cardiac prosthetic devices, implants and grafts, initial encounter: Secondary | ICD-10-CM

## 2020-07-14 DIAGNOSIS — I214 Non-ST elevation (NSTEMI) myocardial infarction: Principal | ICD-10-CM

## 2020-07-14 LAB — TRANSTHORACIC ECHOCARDIOGRAM (TTE) COMPLETE (CONTRAST/BUBBLE/3D PRN)
AV Area by Peak Velocity: 2.8 cm2
AV Area by VTI: 2.5 cm2
AV Mean Gradient: 3 mmHg
AV Mean Velocity: 0.8 m/s
AV Peak Gradient: 5 mmHg
AV Peak Velocity: 1.2 m/s
AV VTI: 25 cm
AV Velocity Ratio: 0.75
AVA/BSA Peak Velocity: 1.2 cm2/m2
AVA/BSA VTI: 1.1 cm2/m2
Ao Root Index: 1.29 cm/m2
Aortic Root: 3 cm
Ascending Aorta Index: 1.29 cm/m2
Ascending Aorta: 3 cm
Body Surface Area: 2.36 m2
E/E' Lateral: 8
E/E' Ratio (Averaged): 8.5
E/E' Septal: 9
EF BP: 52 % — AB (ref 55–100)
Fractional Shortening 2D: 33 % (ref 28–44)
IVC Proxmal: 1.5 cm
IVSd: 1 cm (ref 0.6–1.0)
LA Area 2C: 15 cm2
LA Area 4C: 16.7 cm2
LA Diameter: 3.7 cm
LA Major Axis: 5.6 cm
LA Minor Axis: 5.3 cm
LA Size Index: 1.59 cm/m2
LA Volume BP: 37 mL (ref 18–58)
LA Volume Index BP: 16 ml/m2 (ref 16–34)
LA/AO Root Ratio: 1.23
LV E' Lateral Velocity: 9 cm/s
LV E' Septal Velocity: 8 cm/s
LV EDV A2C: 129 mL
LV EDV A4C: 130 mL
LV EDV Index A2C: 55 mL/m2
LV EDV Index A4C: 56 mL/m2
LV ESV A2C: 63 mL
LV ESV A4C: 59 mL
LV ESV Index A2C: 27 mL/m2
LV ESV Index A4C: 25 mL/m2
LV Ejection Fraction A2C: 51 %
LV Ejection Fraction A4C: 54 %
LV Mass 2D Index: 68.2 g/m2 (ref 49–115)
LV Mass 2D: 158.8 g (ref 88–224)
LV RWT Ratio: 0.43
LVIDd Index: 1.97 cm/m2
LVIDd: 4.6 cm (ref 4.2–5.9)
LVIDs Index: 1.33 cm/m2
LVIDs: 3.1 cm
LVOT Area: 3.5 cm2
LVOT Diameter: 2.1 cm
LVOT Mean Gradient: 1 mmHg
LVOT Peak Gradient: 3 mmHg
LVOT Peak Velocity: 0.9 m/s
LVOT SV: 62.7 ml
LVOT Stroke Volume Index: 26.9 mL/m2
LVOT VTI: 18.1 cm
LVOT:AV VTI Index: 0.72
LVPWd: 1 cm (ref 0.6–1.0)
Left Ventricular Ejection Fraction: 53
MV A Velocity: 0.62 m/s
MV E Velocity: 0.72 m/s
MV E Wave Deceleration Time: 203 ms
MV E/A: 1.16
RV Basal Dimension: 4.2 cm
TAPSE: 1.9 cm (ref 1.7–?)

## 2020-07-14 LAB — POCT GLUCOSE
POC Glucose: 92 mg/dL (ref 65–100)
POC Glucose: 96 mg/dL (ref 65–100)

## 2020-07-14 LAB — EKG 12-LEAD
Atrial Rate: 51 {beats}/min
Atrial Rate: 53 {beats}/min
P Axis: 42 degrees
P Axis: 46 degrees
P-R Interval: 158 ms
P-R Interval: 160 ms
Q-T Interval: 420 ms
Q-T Interval: 430 ms
QRS Duration: 92 ms
QRS Duration: 92 ms
QTc Calculation (Bazett): 394 ms
QTc Calculation (Bazett): 396 ms
R Axis: 18 degrees
R Axis: 8 degrees
T Axis: -25 degrees
T Axis: -25 degrees
Ventricular Rate: 51 {beats}/min
Ventricular Rate: 53 {beats}/min

## 2020-07-14 LAB — CBC
Hematocrit: 47.3 % (ref 41.1–50.3)
Hemoglobin: 15.9 g/dL (ref 13.6–17.2)
MCH: 29.4 PG (ref 26.1–32.9)
MCHC: 33.6 g/dL (ref 31.4–35.0)
MCV: 87.6 FL (ref 79.6–97.8)
MPV: 9.6 FL (ref 9.4–12.3)
Platelets: 302 10*3/uL (ref 150–450)
RBC: 5.4 M/uL (ref 4.23–5.6)
RDW: 13.2 % (ref 11.9–14.6)
WBC: 6.9 10*3/uL (ref 4.3–11.1)
nRBC: 0 10*3/uL (ref 0.0–0.2)

## 2020-07-14 LAB — COMPREHENSIVE METABOLIC PANEL
ALT: 53 U/L (ref 12–65)
AST: 66 U/L — ABNORMAL HIGH (ref 15–37)
Albumin/Globulin Ratio: 1 — ABNORMAL LOW (ref 1.2–3.5)
Albumin: 4 g/dL (ref 3.5–5.0)
Alk Phosphatase: 66 U/L (ref 50–136)
Anion Gap: 6 mmol/L — ABNORMAL LOW (ref 7–16)
BUN: 13 MG/DL (ref 6–23)
CO2: 29 mmol/L (ref 21–32)
Calcium: 9.5 MG/DL (ref 8.3–10.4)
Chloride: 104 mmol/L (ref 98–107)
Creatinine: 1.25 MG/DL (ref 0.8–1.5)
GFR African American: >60 mL/min/{1.73_m2} (ref >60)
GFR Non-African American: >60 mL/min/{1.73_m2} (ref >60)
Globulin: 4.1 g/dL — ABNORMAL HIGH (ref 2.3–3.5)
Glucose: 123 mg/dL — ABNORMAL HIGH (ref 65–100)
Potassium: 4.4 mmol/L (ref 3.5–5.1)
Sodium: 139 mmol/L (ref 136–145)
Total Bilirubin: 0.3 MG/DL (ref 0.2–1.1)
Total Protein: 8.1 g/dL (ref 6.3–8.2)

## 2020-07-14 LAB — PROTIME-INR
INR: 0.9
Protime: 13 s (ref 12.6–14.5)

## 2020-07-14 LAB — TROPONIN
Troponin, High Sensitivity: 1339.4 pg/mL (ref 0–14)
Troponin, High Sensitivity: 4069 pg/mL (ref 0–14)

## 2020-07-14 LAB — POCT ACTIVATED CLOTTING TIME: Activated Clotting Time: 318 SECS — ABNORMAL HIGH (ref 70–128)

## 2020-07-14 LAB — HEMOGLOBIN: Hemoglobin: 14.8 g/dL (ref 13.6–17.2)

## 2020-07-14 LAB — CARDIAC PROCEDURE: Body Surface Area: 2.36 m2

## 2020-07-14 LAB — APTT: PTT: 30 s (ref 24.1–35.1)

## 2020-07-14 MED ORDER — NORMAL SALINE FLUSH 0.9 % IV SOLN
0.9 % | Freq: Two times a day (BID) | INTRAVENOUS | Status: DC
Start: 2020-07-14 — End: 2020-07-15
  Administered 2020-07-15: 01:00:00 10 mL via INTRAVENOUS

## 2020-07-14 MED ORDER — HEPARIN SODIUM (PORCINE) 1000 UNIT/ML IJ SOLN
1000 UNIT/ML | INTRAMUSCULAR | Status: DC | PRN
Start: 2020-07-14 — End: 2020-07-14

## 2020-07-14 MED ORDER — HEPARIN SOD (PORCINE) IN D5W 100 UNIT/ML IV SOLN
100 UNIT/ML | INTRAVENOUS | Status: DC
Start: 2020-07-14 — End: 2020-07-14

## 2020-07-14 MED ORDER — FENTANYL 0.05 MG/ML SOLN (MIXTURES ONLY)
Status: DC | PRN
Start: 2020-07-14 — End: 2020-07-14
  Administered 2020-07-14: 14:00:00 50 via INTRAVENOUS

## 2020-07-14 MED ORDER — MIDAZOLAM HCL 1 MG/ML 2 ML IJ SOLN
1 MG/ML | INTRAMUSCULAR | Status: DC | PRN
Start: 2020-07-14 — End: 2020-07-14
  Administered 2020-07-14: 14:00:00 2 via INTRAVENOUS

## 2020-07-14 MED ORDER — POLYETHYLENE GLYCOL 3350 17 G PO PACK
17 | Freq: Every day | ORAL | Status: DC | PRN
Start: 2020-07-14 — End: 2020-07-15

## 2020-07-14 MED ORDER — TICAGRELOR 90 MG PO TABS
90 | Freq: Two times a day (BID) | ORAL | Status: DC
Start: 2020-07-14 — End: 2020-07-15
  Administered 2020-07-15 (×2): 90 mg via ORAL

## 2020-07-14 MED ORDER — SODIUM CHLORIDE 0.9 % IV SOLN
0.9 % | INTRAVENOUS | Status: DC | PRN
Start: 2020-07-14 — End: 2020-07-15

## 2020-07-14 MED ORDER — ASPIRIN 81 MG PO CHEW
81 MG | ORAL | Status: AC
Start: 2020-07-14 — End: 2020-07-14
  Administered 2020-07-14: 12:00:00 162 mg via ORAL

## 2020-07-14 MED ORDER — HYDROCODONE-ACETAMINOPHEN 5-325 MG PO TABS
5-325 | ORAL | Status: DC | PRN
Start: 2020-07-14 — End: 2020-07-15

## 2020-07-14 MED ORDER — IOPAMIDOL 76 % IV SOLN
76 % | INTRAVENOUS | Status: DC | PRN
Start: 2020-07-14 — End: 2020-07-14
  Administered 2020-07-14: 15:00:00 200 via INTRACORONARY

## 2020-07-14 MED ORDER — NITROGLYCERIN 2 % TD OINT
2 % | TRANSDERMAL | Status: AC
Start: 2020-07-14 — End: 2020-07-14
  Administered 2020-07-14: 13:00:00 2 [in_us] via TOPICAL

## 2020-07-14 MED ORDER — SODIUM CHLORIDE 0.9 % IV SOLN
0.9 | INTRAVENOUS | Status: DC | PRN
Start: 2020-07-14 — End: 2020-07-15

## 2020-07-14 MED ORDER — HEPARIN SODIUM (PORCINE) 1000 UNIT/ML IJ SOLN
1000 UNIT/ML | Freq: Once | INTRAMUSCULAR | Status: AC
Start: 2020-07-14 — End: 2020-07-14
  Administered 2020-07-14: 13:00:00 4000 [IU] via INTRAVENOUS

## 2020-07-14 MED ORDER — NITROGLYCERIN 0.4 MG SL SUBL
0.4 MG | SUBLINGUAL | Status: DC | PRN
Start: 2020-07-14 — End: 2020-07-15

## 2020-07-14 MED ORDER — ASPIRIN 81 MG PO CHEW
81 MG | Freq: Every day | ORAL | Status: DC
Start: 2020-07-14 — End: 2020-07-14

## 2020-07-14 MED ORDER — NITROGLYCERIN 0.4 MG SL SUBL
0.4 MG | SUBLINGUAL | Status: DC | PRN
Start: 2020-07-14 — End: 2020-07-14
  Administered 2020-07-14: 12:00:00 0.4 mg via SUBLINGUAL

## 2020-07-14 MED ORDER — SODIUM CHLORIDE 0.9 % IV SOLN
0.9 % | INTRAVENOUS | Status: AC
Start: 2020-07-14 — End: 2020-07-14
  Administered 2020-07-14: 17:00:00 via INTRAVENOUS

## 2020-07-14 MED ORDER — NORMAL SALINE FLUSH 0.9 % IV SOLN
0.9 % | Freq: Two times a day (BID) | INTRAVENOUS | Status: DC
Start: 2020-07-14 — End: 2020-07-15
  Administered 2020-07-14 – 2020-07-15 (×2): 10 mL via INTRAVENOUS

## 2020-07-14 MED ORDER — TICAGRELOR 90 MG PO TABS
90 | ORAL | Status: AC
Start: 2020-07-14 — End: 2020-07-14

## 2020-07-14 MED ORDER — EPTIFIBATIDE 75 MG/100ML IV SOLN
75100 MG/100ML | INTRAVENOUS | Status: AC
Start: 2020-07-14 — End: 2020-07-15
  Administered 2020-07-14 – 2020-07-15 (×3): 2 ug/kg/min via INTRAVENOUS

## 2020-07-14 MED ORDER — NORMAL SALINE FLUSH 0.9 % IV SOLN
0.9 | INTRAVENOUS | Status: DC | PRN
Start: 2020-07-14 — End: 2020-07-15

## 2020-07-14 MED ORDER — NORMAL SALINE FLUSH 0.9 % IV SOLN
0.9 | Freq: Three times a day (TID) | INTRAVENOUS | Status: DC
Start: 2020-07-14 — End: 2020-07-14

## 2020-07-14 MED ORDER — LIDOCAINE HCL 1 % IJ SOLN
1 % | INTRAMUSCULAR | Status: DC | PRN
Start: 2020-07-14 — End: 2020-07-14
  Administered 2020-07-14: 14:00:00 2 via INTRADERMAL

## 2020-07-14 MED ORDER — CARVEDILOL 3.125 MG PO TABS
3.125 MG | Freq: Two times a day (BID) | ORAL | Status: DC
Start: 2020-07-14 — End: 2020-07-15
  Administered 2020-07-14 – 2020-07-15 (×2): 3.125 mg via ORAL

## 2020-07-14 MED ORDER — ALUM & MAG HYDROXIDE-SIMETH 200-200-20 MG/5ML PO SUSP
200-200-205 MG/5ML | ORAL | Status: AC
Start: 2020-07-14 — End: 2020-07-14
  Administered 2020-07-14: 12:00:00 30 mL via ORAL

## 2020-07-14 MED ORDER — ACETAMINOPHEN 325 MG PO TABS
325 | Freq: Four times a day (QID) | ORAL | Status: DC | PRN
Start: 2020-07-14 — End: 2020-07-15

## 2020-07-14 MED ORDER — EPTIFIBATIDE 20 MG/10ML IV SOLN
20 | INTRAVENOUS | Status: AC
Start: 2020-07-14 — End: 2020-07-14

## 2020-07-14 MED ORDER — SODIUM CHLORIDE 0.9 % IV SOLN
0.9 | INTRAVENOUS | Status: AC
Start: 2020-07-14 — End: 2020-07-14

## 2020-07-14 MED ORDER — EPTIFIBATIDE 75 MG/100ML IV SOLN
75 | INTRAVENOUS | Status: AC
Start: 2020-07-14 — End: 2020-07-14

## 2020-07-14 MED ORDER — ONDANSETRON HCL 4 MG/2ML IJ SOLN
4 MG/2ML | Freq: Four times a day (QID) | INTRAMUSCULAR | Status: DC | PRN
Start: 2020-07-14 — End: 2020-07-14

## 2020-07-14 MED ORDER — NITROPRUSSIDE SODIUM 25 MG/ML IV SOLN
25 MG/ML | INTRAVENOUS | Status: DC | PRN
Start: 2020-07-14 — End: 2020-07-14
  Administered 2020-07-14 (×2): 100 via INTRAVENOUS

## 2020-07-14 MED ORDER — ONDANSETRON HCL 4 MG/2ML IJ SOLN
4 MG/2ML | Freq: Four times a day (QID) | INTRAMUSCULAR | Status: DC | PRN
Start: 2020-07-14 — End: 2020-07-15

## 2020-07-14 MED ORDER — EPTIFIBATIDE 75 MG/100ML IV SOLN
75100 MG/100ML | INTRAVENOUS | Status: AC | PRN
Start: 2020-07-14 — End: 2020-07-14
  Administered 2020-07-14: 15:00:00 19188 via INTRAVENOUS
  Administered 2020-07-14: 14:00:00 2 via INTRAVENOUS

## 2020-07-14 MED ORDER — ACETAMINOPHEN 325 MG PO TABS
325 | ORAL | Status: DC | PRN
Start: 2020-07-14 — End: 2020-07-15

## 2020-07-14 MED ORDER — INSULIN LISPRO 100 UNIT/ML IJ SOLN
100 UNIT/ML | Freq: Every evening | INTRAMUSCULAR | Status: DC
Start: 2020-07-14 — End: 2020-07-15

## 2020-07-14 MED ORDER — ACETAMINOPHEN 650 MG RE SUPP
650 | Freq: Four times a day (QID) | RECTAL | Status: DC | PRN
Start: 2020-07-14 — End: 2020-07-15

## 2020-07-14 MED ORDER — TICAGRELOR 90 MG PO TABS
90 MG | ORAL | Status: DC | PRN
Start: 2020-07-14 — End: 2020-07-14
  Administered 2020-07-14: 15:00:00 180 via ORAL

## 2020-07-14 MED ORDER — HEPARIN SODIUM (PORCINE) 1000 UNIT/ML IJ SOLN
1000 UNIT/ML | INTRAMUSCULAR | Status: DC | PRN
Start: 2020-07-14 — End: 2020-07-14
  Administered 2020-07-14: 14:00:00 2.3 via INTRA_ARTERIAL

## 2020-07-14 MED ORDER — ASPIRIN 81 MG PO CHEW
81 | Freq: Every day | ORAL | Status: DC
Start: 2020-07-14 — End: 2020-07-15
  Administered 2020-07-15: 12:00:00 81 mg via ORAL

## 2020-07-14 MED ORDER — INSULIN LISPRO 100 UNIT/ML IJ SOLN
100 UNIT/ML | Freq: Three times a day (TID) | INTRAMUSCULAR | Status: DC
Start: 2020-07-14 — End: 2020-07-15

## 2020-07-14 MED ORDER — VALSARTAN 80 MG PO TABS
80 | Freq: Every day | ORAL | Status: DC
Start: 2020-07-14 — End: 2020-07-15
  Administered 2020-07-14 – 2020-07-15 (×2): 80 mg via ORAL

## 2020-07-14 MED ORDER — HEPARIN SODIUM (PORCINE) 10000 UNIT/ML IJ SOLN
10000 UNIT/ML | INTRAMUSCULAR | Status: DC | PRN
Start: 2020-07-14 — End: 2020-07-14
  Administered 2020-07-14: 14:00:00 3000 via INTRAVENOUS

## 2020-07-14 MED ORDER — ATORVASTATIN CALCIUM 40 MG PO TABS
40 | Freq: Every evening | ORAL | Status: DC
Start: 2020-07-14 — End: 2020-07-15
  Administered 2020-07-15: 01:00:00 40 mg via ORAL

## 2020-07-14 MED ORDER — HYDROCODONE-ACETAMINOPHEN 5-325 MG PO TABS
5-325 MG | ORAL | Status: DC | PRN
Start: 2020-07-14 — End: 2020-07-15

## 2020-07-14 MED ORDER — ONDANSETRON 4 MG PO TBDP
4 MG | Freq: Three times a day (TID) | ORAL | Status: DC | PRN
Start: 2020-07-14 — End: 2020-07-15

## 2020-07-14 MED ORDER — HEPARIN (PORCINE) IN NACL 25000-0.45 UT/250ML-% IV SOLN
25000-0.45 | INTRAVENOUS | Status: DC
Start: 2020-07-14 — End: 2020-07-14
  Administered 2020-07-14: 13:00:00 9 [IU]/kg/h via INTRAVENOUS

## 2020-07-14 MED ORDER — HEPARIN (PORCINE) IN NACL 2000-0.9 UNIT/L-% IV SOLN
INTRAVENOUS | Status: AC | PRN
Start: 2020-07-14 — End: 2020-07-14
  Administered 2020-07-14: 14:00:00 3 via INTRA_ARTERIAL

## 2020-07-14 MED ORDER — NITROPRUSSIDE SODIUM 25 MG/ML IV SOLN
25 | INTRAVENOUS | Status: AC
Start: 2020-07-14 — End: 2020-07-14

## 2020-07-14 MED FILL — BRILINTA 90 MG PO TABS: 90 mg | ORAL | Qty: 2

## 2020-07-14 MED FILL — EPTIFIBATIDE 20 MG/10ML IV SOLN: 20 MG/10ML | INTRAVENOUS | Qty: 10

## 2020-07-14 MED FILL — ASPIRIN 81 MG PO CHEW: 81 mg | ORAL | Qty: 2

## 2020-07-14 MED FILL — NITROGLYCERIN 0.4 MG SL SUBL: 0.4 mg | SUBLINGUAL | Qty: 25

## 2020-07-14 MED FILL — HEPARIN (PORCINE) IN NACL 25000-0.45 UT/250ML-% IV SOLN: 25000-0.45 UT/250ML-% | INTRAVENOUS | Qty: 250

## 2020-07-14 MED FILL — EPTIFIBATIDE 75 MG/100ML IV SOLN: 75 MG/100ML | INTRAVENOUS | Qty: 100

## 2020-07-14 MED FILL — ACETAMINOPHEN 650 MG RE SUPP: 650 mg | RECTAL | Qty: 1

## 2020-07-14 MED FILL — NITROPRESS 25 MG/ML IV SOLN: 25 mg/mL | INTRAVENOUS | Qty: 2

## 2020-07-14 MED FILL — HEPARIN SODIUM (PORCINE) 1000 UNIT/ML IJ SOLN: 1000 [IU]/mL | INTRAMUSCULAR | Qty: 10

## 2020-07-14 MED FILL — MAG-AL PLUS 200-200-20 MG/5ML PO LIQD: 200-200-20 MG/5ML | ORAL | Qty: 30

## 2020-07-14 MED FILL — NITRO-BID 2 % TD OINT: 2 % | TRANSDERMAL | Qty: 1

## 2020-07-14 MED FILL — VALSARTAN 80 MG PO TABS: 80 mg | ORAL | Qty: 1

## 2020-07-14 MED FILL — SODIUM CHLORIDE 0.9 % IV SOLN: 0.9 % | INTRAVENOUS | Qty: 50

## 2020-07-14 MED FILL — CARVEDILOL 3.125 MG PO TABS: 3.125 mg | ORAL | Qty: 1

## 2020-07-14 NOTE — ED Notes (Signed)
Chest pain resolved after NTG SL 0.4mg  dose #1  Pt sts chest pain is 0/10     Lewayne Bunting, RN  07/14/20 [phone]

## 2020-07-14 NOTE — Progress Notes (Signed)
TRANSFER - IN REPORT:    Verbal report received from Fernley, RN on Eagan Shifflett being received from CCL for routine progression of care.     Report consisted of patient???s Situation, Background, Assessment and Recommendations(SBAR).     Information from the following report(s) SBAR, Kardex, Procedure Summary, Intake/Output, MAR, Recent Results and Cardiac Rhythm Sinus Huston Foley was reviewed. Opportunity for questions and clarification was provided.      Assessment completed upon patient???s arrival to unit and care assumed.     Patient received to room 307. Patient connected to monitor and assessment completed. Plan of care reviewed. Patient oriented to room and call light. Patient aware to use call light to communicate any chest pain or needs.

## 2020-07-14 NOTE — Progress Notes (Signed)
Report received from [facility] RN. Procedural finding communicated. Intra procedural medication administration reviewed. Progression of care discussed.    Patient received into CPRU room 3, Post PCI    Access site without bleeding or swelling. Right wrist    Patient instructed to limit movement of right upper extremity.    Routine post procedural vital signs & site assessment initiated.

## 2020-07-14 NOTE — ED Provider Notes (Addendum)
[facility] Emergency Department Provider Note                   PCP:                No primary care provider on file.               Age: 40 y.o.      Sex: male     No diagnosis found.    DISPOSITION         New Prescriptions    No medications on file       Orders Placed This Encounter   Procedures   ??? XR CHEST PORTABLE   ??? Troponin   ??? CBC   ??? Comprehensive Metabolic Panel   ??? Cardiac Monitor   ??? Pulse Oximetry   ??? EKG 12 Lead   ??? Saline lock IV        Dorothyann Peng, MD 8:03 AM      MDM  Number of Diagnoses or Management Options  Diagnosis management comments: EKG and serial troponins.  Cardiac versus esophageal pain.  Doubt pulmonary embolism.  Check chest x-ray for pneumothorax or effusion.  Nitroglycerin.  Blood pressure reasonable.       Amount and/or Complexity of Data Reviewed  Clinical lab tests: ordered and reviewed  Tests in the radiology section of CPT??: ordered and reviewed  Tests in the medicine section of CPT??: ordered and reviewed  Discuss the patient with other providers: yes  Independent visualization of images, tracings, or specimens: yes (My interpretation EKG shows sinus bradycardia at 51.  T wave inversion inferiorly.  Flattened T waves laterally.  Some Q waves inferiorly.  No ectopy.  Normal QT interval)    Risk of Complications, Morbidity, and/or Mortality  Presenting problems: moderate  Diagnostic procedures: minimal  Management options: low         Christopher Downs is a 40 y.o. male who presents to the Emergency Department with chief complaint of    Chief Complaint   Patient presents with   ??? Chest Pain      40 year old male noticed some left sternal pressure starting about 3 AM.  It is persisted for the last 5 hours.  Slight nausea.  No vomiting or diaphoresis.  No radiation.  No shortness of breath or pleurisy.  He has had no fever chills or cough.  Patient's past history significant for MI approximately 6 months ago with placement evidently of a drug-eluting stent in [place] in November.  Patient did not take nitroglycerin this morning he said the discomfort was not to the level of a heart attack he had previously but pain somewhat reminiscent of his heart issues.  Denies to me any history of hypertension or diabetes.  Only had foot surgery in the past.  No aggravating relieving factors.  He did take 2 aspirin without change in symptoms.    The history is provided by the patient.   Chest Pain  Pain location:  Substernal area  Pain quality: aching    Pain radiates to:  Does not radiate  Pain severity:  Moderate  Onset quality:  Gradual  Duration:  5 hours  Timing:  Constant  Progression:  Unchanged  Chronicity:  New  Context: not breathing    Relieved by:  Nothing  Worsened by:  Nothing  Ineffective treatments:  Aspirin  Associated symptoms: no abdominal pain, no back pain, no cough, no fever, no shortness of breath and  no vomiting    Risk factors: coronary artery disease          Review of Systems   Constitutional: Negative for chills and fever.   Respiratory: Negative for cough and shortness of breath.    Cardiovascular: Positive for chest pain.   Gastrointestinal: Negative for abdominal pain and vomiting.   Musculoskeletal: Negative for back pain.   All other systems reviewed and are negative.      No past medical history on file.     No past surgical history on file.     No family history on file.        Social Connections:    ??? Frequency of Communication with Friends and Family: Not on file   ??? Frequency of Social Gatherings with Friends and Family: Not on file   ??? Attends Religious Services: Not on file   ??? Active Member of Clubs or Organizations: Not on file   ??? Attends Archivist Meetings: Not on file   ??? Marital Status: Not on file        No Known Allergies     Vitals signs and nursing note reviewed.   Patient Vitals for the past 4 hrs:   Temp Pulse Resp BP SpO2   07/14/20 0742 98.3 ??F (36.8 ??C) 62 17 (!) 161/96 97 %          Physical Exam  Vitals and nursing  note reviewed.   Constitutional:       Appearance: He is not ill-appearing.   HENT:      Head: Normocephalic and atraumatic.      Right Ear: External ear normal.      Left Ear: External ear normal.      Mouth/Throat:      Mouth: Mucous membranes are moist.      Pharynx: Oropharynx is clear.   Eyes:      General: No scleral icterus.     Extraocular Movements: Extraocular movements intact.      Pupils: Pupils are equal, round, and reactive to light.   Cardiovascular:      Rate and Rhythm: Normal rate and regular rhythm.   Pulmonary:      Effort: Pulmonary effort is normal.      Breath sounds: Normal breath sounds.   Abdominal:      Palpations: Abdomen is soft.      Tenderness: There is no abdominal tenderness.   Musculoskeletal:         General: No swelling or tenderness.      Cervical back: Normal range of motion and neck supple.      Right lower leg: No edema.      Left lower leg: No edema.   Skin:     General: Skin is warm and dry.   Neurological:      General: No focal deficit present.      Mental Status: He is alert.          Procedures      Labs Reviewed   TROPONIN   CBC   COMPREHENSIVE METABOLIC PANEL   TROPONIN        XR CHEST PORTABLE    (Results Pending)                  Results Include:    Recent Results (from the past 24 hour(s))   Troponin    Collection Time: 07/14/20  7:51 AM   Result Value Ref Range    Troponin, High  Sensitivity 1,339.4 (HH) 0 - 14 pg/mL   CBC    Collection Time: 07/14/20  7:51 AM   Result Value Ref Range    WBC 6.9 4.3 - 11.1 K/uL    RBC 5.40 4.23 - 5.6 M/uL    Hemoglobin 15.9 13.6 - 17.2 g/dL    Hematocrit 47.3 41.1 - 50.3 %    MCV 87.6 79.6 - 97.8 FL    MCH 29.4 26.1 - 32.9 PG    MCHC 33.6 31.4 - 35.0 g/dL    RDW 13.2 11.9 - 14.6 %    Platelets 302 150 - 450 K/uL    MPV 9.6 9.4 - 12.3 FL    nRBC 0.00 0.0 - 0.2 K/uL   Comprehensive Metabolic Panel    Collection Time: 07/14/20  7:51 AM   Result Value Ref Range    Sodium 139 136 - 145 mmol/L    Potassium 4.4 3.5 - 5.1 mmol/L    Chloride  104 98 - 107 mmol/L    CO2 29 21 - 32 mmol/L    Anion Gap 6 (L) 7 - 16 mmol/L    Glucose 123 (H) 65 - 100 mg/dL    BUN 13 6 - 23 MG/DL    CREATININE 1.25 0.8 - 1.5 MG/DL    GFR African American >60 >60 ml/min/1.26m    GFR Non-African American >60 >60 ml/min/1.749m   Calcium 9.5 8.3 - 10.4 MG/DL    Total Bilirubin 0.3 0.2 - 1.1 MG/DL    ALT 53 12 - 65 U/L    AST 66 (H) 15 - 37 U/L    Alk Phosphatase 66 50 - 136 U/L    Total Protein 8.1 6.3 - 8.2 g/dL    Albumin 4.0 3.5 - 5.0 g/dL    Globulin 4.1 (H) 2.3 - 3.5 g/dL    Albumin/Globulin Ratio 1.0 (L) 1.2 - 3.5       XR CHEST PORTABLE    Result Date: 07/14/2020  EXAM: XR CHEST PORTABLE INDICATION: Chest Pain COMPARISON: None FINDINGS: The cardiomediastinal silhouette is within normal limits. No focal parenchymal process. No pleural effusion. No pneumothorax. No acute osseous abnormality.     No acute cardiopulmonary abnormality.     8:47 AM  Pain essentially resolved at this point.  Blood pressure down to 14177ystolic.  Troponin extremely high.  Suspect non-ST elevation MI.  Patient will be bolused with heparin and placed on a drip.  As long as pain-free, nitroglycerin paste.  Nitro drip if pain starts back.  EKG will be repeated.  [place] [facility] [facility] [place] cardiac catheter[facility].     ===================================================================  This patient is critically ill and there is a high probability of of imminent or life threatening deterioration in the patient's condition without immediate management.    The nature of the patient's clinical problem is: Non-ST elevation MI.  Elevated blood pressure    I have spent 45 minutes in direct patient care, documentation, review of labs/xrays/old records, discussion with Family, Colleague .     The time involved in the performance of separately reportable procedures was not counted toward critical care time.     WiDorothyann PengMD; 07/14/2020 '@8' :4978AM  ===================================================================           Voice dictation software was used during the making of this note.  This software is not perfect and grammatical and other typographical errors may be present.  This note has not been completely proofread for  errors.     Dorothyann Peng, MD  07/14/20 7510       Dorothyann Peng, MD  07/14/20 2585       Dorothyann Peng, MD  07/14/20 [phone]

## 2020-07-14 NOTE — Plan of Care (Signed)
Problem: Discharge Planning  Goal: Discharge to home or other facility with appropriate resources  Outcome: Progressing  Flowsheets (Taken 07/14/2020 1230)  Discharge to home or other facility with appropriate resources:   Identify barriers to discharge with patient and caregiver   Arrange for needed discharge resources and transportation as appropriate   Identify discharge learning needs (meds, wound care, etc)   Refer to discharge planning if patient needs post-hospital services based on physician order or complex needs related to functional status, cognitive ability or social support system     Problem: ABCDS Injury Assessment  Goal: Absence of physical injury  Outcome: Progressing     Problem: Skin/Tissue Integrity  Goal: Absence of new skin breakdown  Description: 1.  Monitor for areas of redness and/or skin breakdown  2.  Assess vascular access sites hourly  3.  Every 4-6 hours minimum:  Change oxygen saturation probe site  4.  Every 4-6 hours:  If on nasal continuous positive airway pressure, respiratory therapy assess nares and determine need for appliance change or resting period.  Outcome: Progressing

## 2020-07-14 NOTE — Progress Notes (Signed)
TRANSFER - OUT REPORT:    Verbal report given to [facility] RN on Christopher Downs  being transferred to tele for routine progression of patient care       Report consisted of patient's Situation, Background, Assessment and   Recommendations(SBAR).     Information from the following report(s) Nurse Handoff Report was reviewed with the receiving nurse.    Lines:   Peripheral IV 07/14/20 Left Antecubital (Active)   Site Assessment Clean, dry & intact 07/14/20 0748   Line Status Blood return noted 07/14/20 0947        Opportunity for questions and clarification was provided.      Patient transported with:  Monitor and Registered Nurse

## 2020-07-14 NOTE — ED Notes (Signed)
Attempted to call AZJA Kant  Pt left Black Flip flops in room, Pt was transported to [facility] Cath Lab without shoes. Will attempt to call again later. Pt shoes will be left in ER for pick up behind Secretary's desk.      Lewayne Bunting, RN  07/14/20 [phone]

## 2020-07-14 NOTE — ED Triage Notes (Signed)
Pt presented to ED via POV with family with c/o Chest pain onset his AM. Mid-sternal, reproducable  Pain with palpitation. Pt alert and oriented x3, respiratory rate even, non-labored, vital signs stable, denies nausea, vomiting, diarrhea. No acute distress. Will continue to monitor.  Pt with hx of "heart attacks" x2 PMH

## 2020-07-14 NOTE — ED Notes (Signed)
Pharmacist Jeniffer, Sts Heparin infusion dose of 9 units/kg/hour confirmed.        Lewayne Bunting, RN  07/14/20 [phone]

## 2020-07-14 NOTE — Progress Notes (Signed)
Monitor room a;erted me that pt had a 7 beat run of Vtach. Pt is asymptomatic. Danie Chandler, NP notified.

## 2020-07-14 NOTE — Progress Notes (Signed)
Radial compression band removed at 1530 after slowly reducing air to zero as per hospital protocol.  No bleeding or hematoma noted. 2 x 2 gauze with tegaderm placed over puncture site. The affected extremity is warm and dry to the touch. Patient instructed to call if any bleeding noted on gauze.  Patient verbalized understanding the nursing instructions.

## 2020-07-14 NOTE — Progress Notes (Signed)
TRANSFER - OUT REPORT:    Verbal report given to RN on Dragan Tamburrino  being transferred to [facility] for routine progression of patient care       Report consisted of patient's Situation, Background, Assessment and   Recommendations(SBAR).     Information from the following report(s) Nurse Handoff Report was reviewed with the receiving nurse.    LHC w/ Dr. Theressa Millard  1 stent to RCA  Aspirated clot  R radial  TR band 12 mls  2 mg versed  50 mcg fentanyl  8000 total units heparin  ACT 318  Integrillin bolus and gtt - run for 18 hours  180 mg brilinta

## 2020-07-14 NOTE — ED Notes (Signed)
TRANSFER - OUT REPORT:    Verbal report given to Riki Rusk, RN on Christopher Downs  being transferred to [facility] Cath Lab for urgent transfer       Report consisted of patient's Situation, Background, Assessment and   Recommendations(SBAR).     Information from the following report(s) Nurse Handoff Report and ED SBAR was reviewed with the receiving nurse.    Lines:   Peripheral IV 07/14/20 Left Antecubital (Active)   Site Assessment Clean, dry & intact 07/14/20 0748   Line Status Blood return noted 07/14/20 4650        Opportunity for questions and clarification was provided.      Patient transported with:  Monitor   [facility]       [place], [place]  07/14/20 [phone]

## 2020-07-14 NOTE — ED Notes (Signed)
Victorino Dike, Ph.D in pharmacy confirms Heparin 4000 unit ( ) bolus, will sent Heparin infusion when complete     Lewayne Bunting, RN  07/14/20 [phone]

## 2020-07-14 NOTE — ED Notes (Signed)
EMS arrived, Transporting pt to [facility] cath lab with heparin infusion continued during transport.   EMS [address] [place], [place]  07/14/20 [phone]

## 2020-07-14 NOTE — H&P (Signed)
[facility] History &Physical                 Primary Cardiologist: MD in [place], [place]    Primary Care Physician: None Provider    Admitting Physician: Dr Herbert Seta    Subjective:     Patient is a 40 y.o. male who presents with chest pain.  He has a h/o DES placed in [place] about 6 months ago. He has been taking his ASA but around 3 AM woke w substernal chest pressure w nausea, no SOB, vomiting or diaphoresis.  He took two ASA without improvement in pain.  Pain not as severe as pain w MI 6 months ago.  Pain continued and pt came to the ER.     In ER NA 139, K 4.4, cr 1.25, HS trop 1339, hgb 15.9.  CXR no acute cardiopulmonary process. BP 161/96, HR 62. EKG SB w rate 53 w t wave inversion inferiorly.  Pt transferred downtown urgently for [facility].     Past cardiac eval:  DES placed in [place] about 02-2020    Soc: no tobacco  FH:  [place]    PMH: CAD     No past surgical history on file.   No Known Allergies  Social History     Tobacco Use   ??? Smoking status: Not on file   ??? Smokeless tobacco: Not on file   Substance Use Topics   ??? Alcohol use: Not on file      FH: Uknown     Review of Systems  General: no weight change, no weakness, fever or chills  Skin: no rashes, lumps, or other skin changes  HEENT: no headache, dizziness, lightheadedness, vision changes, hearing changes, tinnitus, vertigo, sinus pressure/pain, bleeding gums, sore throat, or hoarseness  Neck: no swollen glands, goiter, pain or stiffness  Respiratory: no cough, sputum, hemoptysis, no dyspnea, no wheezing  Cardiovascular: + as per HPI  Gastrointestinal: no reflux, constipation, diarrhea, liver problems, GI bleeding  Urinary: no frequency, urgency , hematuria, burning/pain with urination, recent flank pain, polyuria, nocturia, or difficulty urinating  Peripheral Vascular: no claudication, leg cramps, prior DVTs, swelling of calves, legs, or feet, color change, or swelling with redness or tenderness  Musculoskeletal: no muscle or joint  pain/stiffness, joint swelling, erythema of joints, or back pain  Psychiatric: no depression or excessive stress  Neurological: no sensory or motor loss, seizures, syncope, tremors, numbness, tingling, no changes in mood, attention, or speech, no changes in orientation, memory, insight, or judgment.   Hematologic: no anemia, easy bruising or bleeding  Endocrine: no thyroid problems, no heat or cold intolerance, excessive sweating, polyuria, polydipsia, no diabetes.        Objective:       SpO2 99%     No intake/output data recorded.  No intake/output data recorded.    Physical Exam:  General: Well Developed, Well Nourished, No Acute Distress  Head: normocephalic atraumatic   ENT: pupils equal and round, no abnormalities noted  Neck: supple, no JVD, no carotid bruits  Heart: S1S2 with RRR without murmurs or gallops  Lungs: Clear throughout auscultation bilaterally without adventitious sounds  Abd: soft, nontender, nondistended, with good bowel sounds  Ext: warm, no edema  Skin: warm and dry  Psychiatric: Normal mood and affect, A&O x 3  Neurologic: Normal muscle tone      ECG: SB w rate 53 w t wave inversion inferiorly    Data Review:      Recent Results (  from the past 24 hour(s))   Troponin    Collection Time: 07/14/20  7:51 AM   Result Value Ref Range    Troponin, High Sensitivity 1,339.4 (HH) 0 - 14 pg/mL   CBC    Collection Time: 07/14/20  7:51 AM   Result Value Ref Range    WBC 6.9 4.3 - 11.1 K/uL    RBC 5.40 4.23 - 5.6 M/uL    Hemoglobin 15.9 13.6 - 17.2 g/dL    Hematocrit 47.3 41.1 - 50.3 %    MCV 87.6 79.6 - 97.8 FL    MCH 29.4 26.1 - 32.9 PG    MCHC 33.6 31.4 - 35.0 g/dL    RDW 13.2 11.9 - 14.6 %    Platelets 302 150 - 450 K/uL    MPV 9.6 9.4 - 12.3 FL    nRBC 0.00 0.0 - 0.2 K/uL   Comprehensive Metabolic Panel    Collection Time: 07/14/20  7:51 AM   Result Value Ref Range    Sodium 139 136 - 145 mmol/L    Potassium 4.4 3.5 - 5.1 mmol/L    Chloride 104 98 - 107 mmol/L    CO2 29 21 - 32 mmol/L    Anion Gap 6  (L) 7 - 16 mmol/L    Glucose 123 (H) 65 - 100 mg/dL    BUN 13 6 - 23 MG/DL    CREATININE 1.25 0.8 - 1.5 MG/DL    GFR African American >60 >60 ml/min/1.64m    GFR Non-African American >60 >60 ml/min/1.744m   Calcium 9.5 8.3 - 10.4 MG/DL    Total Bilirubin 0.3 0.2 - 1.1 MG/DL    ALT 53 12 - 65 U/L    AST 66 (H) 15 - 37 U/L    Alk Phosphatase 66 50 - 136 U/L    Total Protein 8.1 6.3 - 8.2 g/dL    Albumin 4.0 3.5 - 5.0 g/dL    Globulin 4.1 (H) 2.3 - 3.5 g/dL    Albumin/Globulin Ratio 1.0 (L) 1.2 - 3.5         Assessment/Plan:   NSTEMI (non-ST elevated myocardial infarction) (HCC)- acute CP w elevated trop and PCI 6 months ago in [place]  - admit, urgent LHC  - DAP therapy, statin, ARB, consider BB depending on HR and findings on LHC  - echo  - check lipids in AM     Hypertension  -add ARB, BB if HR will allow (need to assess home meds)      CAD (coronary artery disease)  - as above    Hyperglycemia- assess home meds, cover w sliding scale     KeLorenda CahillPA, PA-C  07/14/2020  9:57 AM

## 2020-07-14 NOTE — Progress Notes (Signed)
Admission skin assessment completed with Trinna Post, RN and reveals the following: Patient's skin is warm, dry and color is appropriate for ethnicity. The sacrum and heels are intact. There is a right radial puncture site post left heart cath today with a TR band applying compression. The right radial site is clean, dry and intact with no bleeding or hematoma.

## 2020-07-15 LAB — LIPID PANEL
Chol/HDL Ratio: 6.3
Cholesterol, Total: 227 MG/DL — ABNORMAL HIGH (ref <200)
HDL: 36 MG/DL — ABNORMAL LOW (ref 40–60)
LDL Calculated: ELEVATED MG/DL (ref <100)
Triglycerides: 566 MG/DL — ABNORMAL HIGH (ref 35–150)
VLDL Cholesterol Calculated: 113.2 MG/DL — ABNORMAL HIGH (ref 6.0–23.0)

## 2020-07-15 LAB — POCT GLUCOSE
POC Glucose: 91 mg/dL (ref 65–100)
POC Glucose: 115 mg/dL — ABNORMAL HIGH (ref 65–100)
POC Glucose: 102 mg/dL — ABNORMAL HIGH (ref 65–100)

## 2020-07-15 LAB — CBC
Hematocrit: 42.3 % (ref 41.1–50.3)
Hemoglobin: 14.8 g/dL (ref 13.6–17.2)
MCH: 30.2 PG (ref 26.1–32.9)
MCHC: 35 g/dL (ref 31.4–35.0)
MCV: 86.3 FL (ref 79.6–97.8)
MPV: 9.8 FL (ref 9.4–12.3)
Platelets: 272 10*3/uL (ref 150–450)
RBC: 4.9 M/uL (ref 4.23–5.6)
RDW: 13.2 % (ref 11.9–14.6)
WBC: 7.4 10*3/uL (ref 4.3–11.1)
nRBC: 0 10*3/uL (ref 0.0–0.2)

## 2020-07-15 LAB — BASIC METABOLIC PANEL
Anion Gap: 7 mmol/L (ref 7–16)
BUN: 13 MG/DL (ref 6–23)
CO2: 25 mmol/L (ref 21–32)
Calcium: 8.6 MG/DL (ref 8.3–10.4)
Chloride: 108 mmol/L — ABNORMAL HIGH (ref 98–107)
Creatinine: 1.2 MG/DL (ref 0.8–1.5)
GFR African American: >60 mL/min/{1.73_m2} (ref >60)
GFR Non-African American: >60 mL/min/{1.73_m2} (ref >60)
Glucose: 131 mg/dL — ABNORMAL HIGH (ref 65–100)
Potassium: 3.9 mmol/L (ref 3.5–5.1)
Sodium: 140 mmol/L (ref 136–145)

## 2020-07-15 LAB — MAGNESIUM: Magnesium: 2 mg/dL (ref 1.8–2.4)

## 2020-07-15 LAB — LDL CHOLESTEROL, DIRECT: LDL Direct: 146 mg/dl — ABNORMAL HIGH (ref <100)

## 2020-07-15 MED ORDER — VALSARTAN 80 MG PO TABS
80 MG | ORAL_TABLET | Freq: Every day | ORAL | 3 refills | Status: DC
Start: 2020-07-15 — End: 2020-07-15

## 2020-07-15 MED ORDER — ATORVASTATIN CALCIUM 40 MG PO TABS
40 MG | ORAL_TABLET | Freq: Every evening | ORAL | 3 refills | Status: DC
Start: 2020-07-15 — End: 2020-07-29

## 2020-07-15 MED ORDER — ATORVASTATIN CALCIUM 40 MG PO TABS
40 MG | ORAL_TABLET | Freq: Every evening | ORAL | 3 refills | Status: DC
Start: 2020-07-15 — End: 2020-07-15

## 2020-07-15 MED ORDER — CARVEDILOL 3.125 MG PO TABS
3.125 MG | ORAL_TABLET | Freq: Two times a day (BID) | ORAL | 3 refills | Status: DC
Start: 2020-07-15 — End: 2020-07-15

## 2020-07-15 MED ORDER — CARVEDILOL 3.125 MG PO TABS
3.125 MG | ORAL_TABLET | Freq: Two times a day (BID) | ORAL | 3 refills | Status: DC
Start: 2020-07-15 — End: 2020-07-29

## 2020-07-15 MED ORDER — TICAGRELOR 90 MG PO TABS
90 MG | ORAL_TABLET | Freq: Two times a day (BID) | ORAL | 11 refills | Status: DC
Start: 2020-07-15 — End: 2020-07-15

## 2020-07-15 MED ORDER — NITROGLYCERIN 0.4 MG SL SUBL
0.4 MG | ORAL_TABLET | SUBLINGUAL | 3 refills | Status: DC
Start: 2020-07-15 — End: 2020-07-15

## 2020-07-15 MED ORDER — VALSARTAN 80 MG PO TABS
80 MG | ORAL_TABLET | Freq: Every day | ORAL | 3 refills | Status: DC
Start: 2020-07-15 — End: 2020-07-29

## 2020-07-15 MED ORDER — NITROGLYCERIN 0.4 MG SL SUBL
0.4 MG | ORAL_TABLET | SUBLINGUAL | 3 refills | Status: AC
Start: 2020-07-15 — End: 2022-01-19

## 2020-07-15 MED ORDER — TICAGRELOR 90 MG PO TABS
90 MG | ORAL_TABLET | Freq: Two times a day (BID) | ORAL | 11 refills | Status: DC
Start: 2020-07-15 — End: 2020-07-29

## 2020-07-15 MED FILL — ASPIRIN 81 MG PO CHEW: 81 mg | ORAL | Qty: 1

## 2020-07-15 MED FILL — VALSARTAN 80 MG PO TABS: 80 mg | ORAL | Qty: 1

## 2020-07-15 MED FILL — BRILINTA 90 MG PO TABS: 90 mg | ORAL | Qty: 1

## 2020-07-15 MED FILL — ATORVASTATIN CALCIUM 40 MG PO TABS: 40 mg | ORAL | Qty: 1

## 2020-07-15 MED FILL — EPTIFIBATIDE 75 MG/100ML IV SOLN: 75 MG/100ML | INTRAVENOUS | Qty: 100

## 2020-07-15 MED FILL — CARVEDILOL 3.125 MG PO TABS: 3.125 mg | ORAL | Qty: 1

## 2020-07-15 NOTE — Plan of Care (Signed)
Problem: Discharge Planning  Goal: Discharge to home or other facility with appropriate resources  Outcome: Completed  Flowsheets (Taken 07/15/2020 0821)  Discharge to home or other facility with appropriate resources:   Identify barriers to discharge with patient and caregiver   Arrange for needed discharge resources and transportation as appropriate   Identify discharge learning needs (meds, wound care, etc)   Refer to discharge planning if patient needs post-hospital services based on physician order or complex needs related to functional status, cognitive ability or social support system     Problem: ABCDS Injury Assessment  Goal: Absence of physical injury  Outcome: Completed  Flowsheets (Taken 07/15/2020 0821)  Absence of Physical Injury: Implement safety measures based on patient assessment     Problem: Skin/Tissue Integrity  Goal: Absence of new skin breakdown  Description: 1.  Monitor for areas of redness and/or skin breakdown  2.  Assess vascular access sites hourly  3.  Every 4-6 hours minimum:  Change oxygen saturation probe site  4.  Every 4-6 hours:  If on nasal continuous positive airway pressure, respiratory therapy assess nares and determine need for appliance change or resting period.  Outcome: Completed

## 2020-07-15 NOTE — Discharge Summary (Signed)
[facility] Discharge Summary     Patient ID:  Christopher Downs  [id number]  40 y.o.  1980-02-11    Admit date: 07/14/2020    Discharge date:  07/15/2020    Admitting Physician: Joelene Millin, MD     Discharge Physician: Dr. Haynes Hoehn    Admission Diagnoses: NSTEMI (non-ST elevated myocardial infarction) [facility]) [I21.4]    Discharge Diagnoses:    Diagnosis    NSTEMI (non-ST elevated myocardial infarction) [facility])    Hypertension    CAD (coronary artery disease)    Hyperglycemia       Cardiology Procedures this admission:  Left heart catheterization with PCI  EchoCardiogram  Consults: none    Hospital Course: Patient presented to the ER with c/o chest pain.  Patient reported having stent placed 6 months ago in [address] but had only been taking his aspirin.  EKG showed t wave inversion concerning for ischemia.    Patient underwent cardiac catheterization by Dr. Theressa Millard. 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  He underwent successful mechanical thrombosis of the RCA and then stenting with Onyx DES 4.5x26 mm.  Patient tolerated the procedure well and was taken to the telemetry floor for recovery.     Echo results:    Left Ventricle: Low normal left ventricular systolic function with a visually estimated EF of 50 - 55%. Left ventricle size is normal. Mildly increased wall thickness. Normal diastolic function. See diagram for wall motion findings.    Mitral Valve: Mild regurgitation.    Technical qualifiers: Color flow Doppler was performed and pulse wave and/or continuous wave Doppler was performed.      The morning of discharge, patient was up feeling well without any complaints of chest pain or shortness of breath. Patient's right radial cath site was clean, dry and intact without hematoma or bruit. Patient's labs were WNL. Patient was seen and examined by Dr. Haynes Hoehn and determined stable and ready for discharge. Patient was instructed on the importance of medication  compliance including taking ASA and Brilinta everyday without missing a dose.  After receiving drug eluting stents, the patient will remain on dual anti-platelet therapy for 1 year.  For maximized medical therapy for CAD, patient will continue BB, ARB and statin.   The patient will follow up with Dr Theressa Millard on July 14th at 3:45 pm in [facility]. Patient has been referred to cardiac rehab.    DISPOSITION: The patient is being discharged home in stable condition on a low saturated fat, low cholesterol and low salt diet. The patient is instructed to advance activities as tolerated to the limit of fatigue or shortness of breath. The patient is instructed to avoid all heavy lifting, straining, stooping or squatting for 3-5 days. The patient is instructed to watch the cath site for bleeding/oozing; if seen, the patient is instructed to apply firm pressure with a clean cloth and call [facility] at [phone]. The patient is instructed to watch for signs of infection which include: increasing area of redness, fever/hot to touch or purulent drainage at the catheterization site. The patient is instructed not to soak in a bathtub for 7-10 days, but is cleared to shower. The patient is instructed to call the office or return to the ER for immediate evaluation for any shortness of breath or chest pain not relieved by NTG.      Discharge Exam: BP (!) 151/95    Pulse 67    Temp 98.5 ??F (36.9 ??C) (Oral)  Resp 18    Ht 6\' 2"  (1.88 m)    Wt 237 lb (107.5 kg)    SpO2 98%    BMI 30.43 kg/m??   Patient has been seen by Dr. : see his progress note for exam details.    Recent Results (from the past 24 hour(s))   Cardiac procedure    Collection Time: 07/14/20 11:36 AM   Result Value Ref Range    Body Surface Area 2.36 m2   Transthoracic echocardiogram (TTE) complete with contrast, bubble, strain, and 3D PRN    Collection Time: 07/14/20 12:21 PM   Result Value Ref Range    Body Surface Area 2.36 m2    LV EDV A2C 129 mL     LV EDV A4C 130 mL    LV ESV A2C 63 mL    LV ESV A4C 59 mL    IVSd 1.0 0.6 - 1.0 cm    LVIDd 4.6 4.2 - 5.9 cm    LVIDs 3.1 cm    LVOT Diameter 2.1 cm    LVOT Mean Gradient 1 mmHg    LVOT VTI 18.1 cm    LVOT Peak Velocity 0.9 m/s    LVOT Peak Gradient 3 mmHg    LVPWd 1.0 0.6 - 1.0 cm    LV E' Lateral Velocity 9 cm/s    LV E' Septal Velocity 8 cm/s    LV Ejection Fraction A2C 51 %    LV Ejection Fraction A4C 54 %    EF BP 52 (A) 55 - 100 %    LVOT Area 3.5 cm2    LVOT SV 62.7 ml    LA Minor Axis 5.3 cm    LA Major Axis 5.6 cm    LA Area 2C 15.0 cm2    LA Area 4C 16.7 cm2    LA Volume BP 37 18 - 58 mL    LA Diameter 3.7 cm    AV Mean Velocity 0.8 m/s    AV Mean Gradient 3 mmHg    AV VTI 25.0 cm    AV Peak Velocity 1.2 m/s    AV Peak Gradient 5 mmHg    AV Area by VTI 2.5 cm2    AV Area by Peak Velocity 2.8 cm2    Aortic Root 3.0 cm    Ascending Aorta 3.0 cm    IVC Proxmal 1.5 cm    MV E Wave Deceleration Time 203.0 ms    MV A Velocity 0.62 m/s    MV E Velocity 0.72 m/s    RV Basal Dimension 4.2 cm    TAPSE 1.9 1.7 cm    Fractional Shortening 2D 33 28 - 44 %    LV ESV Index A4C 25 mL/m2    LV EDV Index A4C 56 mL/m2    LV ESV Index A2C 27 mL/m2    LV EDV Index A2C 55 mL/m2    LVIDd Index 1.97 cm/m2    LVIDs Index 1.33 cm/m2    LV RWT Ratio 0.43     LV Mass 2D 158.8 88 - 224 g    LV Mass 2D Index 68.2 49 - 115 g/m2    MV E/A 1.16     E/E' Ratio (Averaged) 8.50     E/E' Lateral 8.00     E/E' Septal 9.00     LA Volume Index BP 16 16 - 34 ml/m2    LVOT Stroke Volume Index 26.9 mL/m2    LA Size Index 1.59  cm/m2    LA/AO Root Ratio 1.23     Ao Root Index 1.29 cm/m2    Ascending Aorta Index 1.29 cm/m2    AV Velocity Ratio 0.75     LVOT:AV VTI Index 0.72     AVA/BSA VTI 1.1 cm2/m2    AVA/BSA Peak Velocity 1.2 cm2/m2   Troponin    Collection Time: 07/14/20  1:05 PM   Result Value Ref Range    Troponin, High Sensitivity 4,069.0 (HH) 0 - 14 pg/mL   Hemoglobin    Collection Time: 07/14/20  1:05 PM   Result Value Ref Range     Hemoglobin 14.8 13.6 - 17.2 g/dL   POCT Glucose    Collection Time: 07/14/20  1:18 PM   Result Value Ref Range    POC Glucose 92 65 - 100 mg/dL    Performed by: BennettPCTVenus    POCT Glucose    Collection Time: 07/14/20  4:47 PM   Result Value Ref Range    POC Glucose 96 65 - 100 mg/dL    Performed by: BennettPCTVenus    POCT Glucose    Collection Time: 07/14/20  9:26 PM   Result Value Ref Range    POC Glucose 91 65 - 100 mg/dL    Performed by: DanielCarriePCT    Lipid Panel    Collection Time: 07/15/20  3:06 AM   Result Value Ref Range    Cholesterol, Total 227 (H) <200 MG/DL    Triglycerides 454 (H) 35 - 150 MG/DL    HDL 36 (L) 40 - 60 MG/DL    LDL Calculated Not calculated due to elevated triglyceride level <100 MG/DL    VLDL Cholesterol Calculated 113.2 (H) 6.0 - 23.0 MG/DL    Chol/HDL Ratio 6.3     CBC    Collection Time: 07/15/20  3:09 AM   Result Value Ref Range    WBC 7.4 4.3 - 11.1 K/uL    RBC 4.90 4.23 - 5.6 M/uL    Hemoglobin 14.8 13.6 - 17.2 g/dL    Hematocrit 09.8 11.9 - 50.3 %    MCV 86.3 79.6 - 97.8 FL    MCH 30.2 26.1 - 32.9 PG    MCHC 35.0 31.4 - 35.0 g/dL    RDW 14.7 82.9 - 56.2 %    Platelets 272 150 - 450 K/uL    MPV 9.8 9.4 - 12.3 FL    nRBC 0.00 0.0 - 0.2 K/uL   Basic Metabolic Panel    Collection Time: 07/15/20  3:09 AM   Result Value Ref Range    Sodium 140 136 - 145 mmol/L    Potassium 3.9 3.5 - 5.1 mmol/L    Chloride 108 (H) 98 - 107 mmol/L    CO2 25 21 - 32 mmol/L    Anion Gap 7 7 - 16 mmol/L    Glucose 131 (H) 65 - 100 mg/dL    BUN 13 6 - 23 MG/DL    CREATININE 1.30 0.8 - 1.5 MG/DL    GFR African American >60 >60 ml/min/1.59m2    GFR Non-African American >60 >60 ml/min/1.45m2    Calcium 8.6 8.3 - 10.4 MG/DL   Magnesium    Collection Time: 07/15/20  3:09 AM   Result Value Ref Range    Magnesium 2.0 1.8 - 2.4 mg/dL   POCT Glucose    Collection Time: 07/15/20  7:47 AM   Result Value Ref Range    POC Glucose 115 (H) 65 - 100 mg/dL  Performed by: Lemar LivingsruzKrystalPCT      Patient  Instructions:  Current Discharge Medication List        START taking these medications    Details   nitroGLYCERIN (NITROSTAT) 0.4 MG SL tablet up to max of 3 total doses. If no relief after 1 dose, call 911.  Qty: 25 tablet, Refills: 3      atorvastatin (LIPITOR) 40 MG tablet Take 1 tablet by mouth nightly  Qty: 30 tablet, Refills: 3      valsartan (DIOVAN) 80 MG tablet Take 1 tablet by mouth daily  Qty: 30 tablet, Refills: 3      carvedilol (COREG) 3.125 MG tablet Take 1 tablet by mouth 2 times daily (with meals)  Qty: 60 tablet, Refills: 3      ticagrelor (BRILINTA) 90 MG TABS tablet Take 1 tablet by mouth 2 times daily  Qty: 60 tablet, Refills: 11           CONTINUE these medications which have NOT CHANGED    Details   aspirin 81 MG chewable tablet Take 81 mg by mouth daily           STOP taking these medications       clopidogrel (PLAVIX) 75 MG tablet Comments:   Reason for Stopping:               I have personally seen and examined patient and agree with above assessment.  Please see my progress note for more details.    Lenna GilfordShawn K Bhavika Schnider, MD  07/15/2020

## 2020-07-15 NOTE — Progress Notes (Signed)
I have reviewed discharge instructions with the patient. Patient has no further questions.    Patient signed discharge paper.

## 2020-07-15 NOTE — Discharge Instructions (Signed)
DISPOSITION: The patient is being discharged home in stable condition on a low saturated fat, low cholesterol and low salt diet. The patient is instructed to advance activities as tolerated to the limit of fatigue or shortness of breath. The patient is instructed to avoid all heavy lifting, straining, stooping or squatting for 3-5 days. The patient is instructed to watch the cath site for bleeding/oozing; if seen, the patient is instructed to apply firm pressure with a clean cloth and call [facility] at [phone]. The patient is instructed to watch for signs of infection which include: increasing area of redness, fever/hot to touch or purulent drainage at the catheterization site. The patient is instructed not to soak in a bathtub for 7-10 days, but is cleared to shower. The patient is instructed to call the office or return to the ER for immediate evaluation for any shortness of breath or chest pain not relieved by NTG.     Left Heart Catheterization: About This Test  What is it?     Left heart catheterization is a test to check the left side of your heart. Your doctor might check the structure of your heart, the motion of your heart, orthe blood pressure inside the chambers.  Why is this test done?  This test gives information about how your heart is working. It can:  Marland Kitchen Check blood flow and blood pressure in the chambers of the heart.  . Check the pumping action of the heart.  . Find out if a heart defect is present and how severe it is.  . Find out how well the heart valves work.  How is the test done?  Marland Kitchen You may get medicine to help you relax.  . You will get a shot to numb the skin where the catheter goes in.  . A thin tube called a catheter is put into a blood vessel in your groin or wrist. The doctor moves the catheter through the blood vessel into your heart.  Bartolo Darter may be injected into your heart. Your doctor can watch on special monitors as the dye moves in your heart. The dye helps your doctor  see blood flow in your heart.  . If a heart defect is found, cardiac catheterization sometimes is used to repair it during the test.  . After the procedure, pressure may be applied for a short time to the area where the catheter was put into your blood vessel. This will help prevent bleeding. A small device may also be used to close the blood vessel. You may have a bandage or compression device on the catheter site.  . You will stay in a room for at least a few hours to make sure the catheter site starts to heal.  . If the catheter was placed in your groin, you may lie in bed for up to a few hours. If the catheter was put in your wrist, you will need to keep your arm still for at least 1 hour.  How long does it take?  The test may take about 1 hour. But you need time to get ready for the test andtime to recover. This can take a few hours.  What happens after the test?  . You may or may not need to stay in the hospital overnight. You will get more instructions for what to do at home.  . Drink plenty of fluids for several hours after the test. If you have kidney, heart, or liver disease and have to  limit fluids, talk with your doctor before you increase the amount of fluids you drink.  Follow-up care is a key part of your treatment and safety. Be sure to make and go to all appointments, and call your doctor if you are having problems. It's also a good idea to know your test results and keep alist of the medicines you take.  Where can you learn more?  Go to [email or web address] and sign in to your MyChart account. Enter W306 in the Search Health Information box to learn more about "Left Heart Catheterization: About This Test."     If you do not have an account, please click on the "Sign Up Now" link.  Current as of: January 10, 2022Content Version: 13.3   2006-2022 [employer].   Care instructions adapted under license by [facility]. If you have questions about a  medical condition or this instruction, always ask your healthcare professional. [employer] disclaims any warranty or liability for your use of this information.         valsartan  Pronunciation: val SAR tan  Brand: Diovan  What is the most important information I should know about valsartan?  Do not use if you are pregnant. Stop using valsartan and tell your doctor right away if you become pregnant.   If you have diabetes, do not take valsartan with any medication that contains aliskiren (a bloodpressure medicine).  What is valsartan?  Valsartan is an angiotensin II receptor blocker (sometimes called an ARB).  Valsartan is used to treat high blood pressure (hypertension) in adults and children who are at least 65 year old. Lowering blood pressure may lower yourrisk of a stroke or heart attack.  Valsartan is also used in adults to treat heart failure and lower your risk of needing to be hospitalized, and to lower your risk of death after a heartattack.  Valsartan may also be used for purposes not listed in this medication guide.  What should I discuss with my healthcare provider before taking valsartan?  You should not use valsartan if you are allergic to it.  If you have diabetes, do not take valsartan with any medication that contains aliskiren (a bloodpressure medicine).  You may also need to avoid taking valsartan with aliskiren if you have kidney disease.  Tell your doctor if you have ever had:  . a heart condition other than one being treated with valsartan;  . kidney disease (or if you are on dialysis); or  . if you are on a low-salt diet.  Do not use if you are pregnant. Stop using the medicine and tell your doctor right away if you become pregnant. Valsartan can cause injury or death to the unborn baby if you take themedicine during your second or third trimester.  If you plan to get pregnant, ask your doctor for a safer medicine to use before and during pregnancy. Having high blood pressure  during pregnancy may cause complications in themother and the baby.  You should not breastfeed while using this medicine.  Valsartan is not approved for use by anyone younger than 38 year old.  How should I take valsartan?  Follow all directions on your prescription label and read all medication guides or instruction sheets. Your doctor may occasionally change your dose. Use themedicine exactly as directed.  Take valsartan at the same time(s) each day, with or without food.  For a child who cannot swallow a tablet  whole, a pharmacist can mix the medicine into a liquid. If  your child switches from valsartan tablets to liquid, the dose will not be the same.  Shake the liquid  for 10 seconds before you measure a dose. Use the dosing syringe provided, oruse a medicine dose-measuring device (not a kitchen spoon).  Valsartan doses are based on weight in children and/or teenagers. Your child'sdose needs may change if the child gains or loses weight.  Your blood pressure will need to be checked often. Your kidney function mayalso need to be checked.  Call your doctor if you are sick with vomiting or diarrhea, or if you are sweating more than usual. You can easily become dehydrated while takingvalsartan.  It may take 2 to 4 weeks before your blood pressure is under control. Keep using this medicine as directed, even if you feel well. High blood pressure often has no symptoms.  You may need to use blood pressure medicine for the rest of your life. Treatment may also include diet, exercise, lowering cholesterol, not smoking,and controlling diabetes.  Store the tablets or liquid at room temperature away from moisture and heat. Throw away any unused liquidafter 30 days.  The liquid may be stored for up to 75 days if kept in a refrigerator.  Keep valsartan liquid in the original glass bottle, tightly closed when not in use.  What happens if I miss a dose?  Take the medicine as soon as you can, but skip the missed dose if it is  almost time for your next dose. Do not take two doses at one time.  What happens if I overdose?  Seek emergency medical attention or call the Poison Help line at [phone].  Overdose symptoms may include fast heartbeats or fainting.  What should I avoid while taking valsartan?  Do not use potassium supplements or salt substitutes unless your doctor hastold you to.  Avoid getting up too fast from a sitting or lying position, or you may feeldizzy.  What are the possible side effects of valsartan?  Get emergency medical help if you have signs of an allergic reaction: hives; difficult breathing; swelling of your face, lips, tongue, or throat.  Call your doctor at once if you have:  . a light-headed feeling, like you might pass out;  . little or no urination; or  . high potassium level --nausea, weakness, tingly feeling, chest pain, irregular heartbeats, loss of movement.  Common side effects may include:  . high potassium;  . headache, dizziness, feeling light-headed;  . flu symptoms, tiredness;  . cough;  . stomach pain, diarrhea;  . back pain, joint pain; or  . abnormal kidney test.  This is not a complete list of side effects and others may occur. Call your doctor for medical advice about side effects. You may report side effects toFDA at 1-800-FDA-1088.  What other drugs will affect valsartan?  Tell your doctor about all your other medicines, especially:  . lithium;  . a diuretic (water pill) or other medicines that lower blood pressure; or  . NSAIDs (nonsteroidal anti-inflammatory drugs) --aspirin, ibuprofen (Advil, Motrin), naproxen (Aleve), celecoxib, diclofenac, indomethacin, meloxicam, and others;  This list is not complete. Other drugs may affect valsartan, including prescription and over-the-counter medicines, vitamins, and herbal products. Notall possible drug interactions are listed here.  Where can I get more information?  Your pharmacist can provide more information about valsartan.  Remember, keep  this and all other medicines out of the reach of children, never share your medicines with others, and use this medication only for the indication  prescribed.   Every effort has been made to ensure that the information provided by [employer] ('[employer]') is accurate, up-to-date, and complete, but no guarantee is made to that effect. Drug information contained herein may be time sensitive. [employer] information has been compiled for use by healthcare practitioners and consumers in the [place] and therefore [employer] does not warrant that uses outside of the [place] are appropriate, unless specifically indicated otherwise. [employer]'s drug information does not endorse drugs, diagnose patients or recommend therapy. [employer]'s drug information is an [employer], corporate to assist licensed healthcare practitioners in caring for their patients and/or to serve consumers viewing this service as a supplement to, and not a substitute for, the expertise, skill, knowledge and judgment of healthcare practitioners. The absence of a warning for a given drug or drug combination in no way should be construed to indicate that the drug or drug combination is safe, effective or appropriate for any given patient. [employer] does not assume any responsibility for any aspect of healthcare administered with the aid of information [employer] provides. The information contained herein is not intended to cover all possible uses, directions, precautions, warnings, drug interactions, allergic reactions, or adverse effects. If you have questions about the drugs you are taking, check with yourdoctor, nurse or pharmacist.  Copyright [phone] [employer] Version: 18.01. Revision date:05/12/2019.  Care instructions adapted under license by [facility]. If you have questions about a medical condition or this instruction, always ask your healthcare professional. [employer] Inc[employer] disclaims any warranty or liability for your  use of this information.         ticagrelor  Pronunciation: tye KA grel or  Brand: Brilinta (ticagrelor)  What is the most important information I should know about ticagrelor?  You should not use ticagrelor if you have any active bleeding or a history of bleeding in the brain. Do not use this medicine just before heart bypasssurgery.  Ticagrelor may cause you to bleed more easily, which can be severe or life-threatening. Call your doctor or seek emergency medical attention if you have bleeding that will not stop, black or bloody stools, red or pink urine, or if you cough upblood or vomit that looks like coffee grounds.  Tell your doctor about all your current medicines and any you start or stop using. Many drugs can interact with ticagrelor.  Do not stop taking ticagrelor without first talking to your doctor, even if you have signs of bleeding. Stopping ticagrelor may increase your risk of a heart attack or stroke.  What is ticagrelor?  Ticagrelor is used to lower your risk of heart attack, stroke, or death due toa blocked artery or a prior heart attack.  Ticagrelor is also used to lower your risk of blood clots if you have coronary artery disease (decreased blood flow to the heart) and have been treated withstents to open clogged arteries.  Ticagrelor is also used to lower your risk of a first heart attack or stroke ifyou have decreased blood flow to the heart.  Ticagrelor is also used to lower the risk of stroke and death in adults with ablockage or decreased blood flow in an artery that supplies blood to the brain.  Ticagrelor is usually given together with low-dose aspirin. Carefully follow your doctor's dosing instructions. Using too much aspirin can make ticagrelor less effective.  Ticagrelor may also be used for purposes not listed in this medication guide.  What should I discuss with my healthcare provider before taking ticagrelor?  You should not use ticagrelor if you are allergic to it, or if you  have:  . any active bleeding; or  . a history of bleeding in the brain (such as from a head injury).  Tell your doctor if you have ever had:  . a stroke;  . heart problems;  . a surgery or bleeding injury;  . bleeding problems;  . a stomach ulcer or colon polyps;  . liver disease; or  . asthma, COPD (chronic obstructive pulmonary disorder) or other breathing problem.  It is not known whether this medicine will harm an unborn baby. Tell yourdoctor if you are pregnant or plan to become pregnant.  You should not breastfeed while using ticagrelor.  How should I take ticagrelor?  Follow all directions on your prescription label and read all medication guides or instruction sheets. Ticagrelor is taken together with aspirin. Use these medicines exactly as directed.  Do not take more aspirin than your doctor has prescribed. Taking too much aspirin can make ticagrelor less effective.  Take ticagrelor at the same times each day, with or without food.  If you cannot swallow a tablet whole, crush the pill and mix it with water. Stir and drink this mixture right away.Add more water to the glass, stir, and drink right away.  Ticagrelor keeps your blood from coagulating (clotting) and can make it easier for you to bleed, even from a minor injury. Contact your doctor or seekemergency medical attention if you have any bleeding that will not stop.  To prevent excessive bleeding, you may need to stop using ticagrelor for a short time before a surgery, medical procedure, or dental work. Any healthcare provider who treats you should know that you are taking ticagrelor.  Do not stop taking ticagrelor without first talking to your doctor, even if you have signs of bleeding. Stopping the medicine could increase your risk of a heart attack or stroke.  This medicine may affect medical testing for platelets in your blood and youmay have false results. Tell the laboratory staff that you use ticagrelor.  Store at room temperature away from  moisture and heat.  What happens if I miss a dose?  Skip the missed dose and use your next dose at the regular time. Do not use two doses at one time.  What happens if I overdose?  Seek emergency medical attention or call the Poison Help line at8171044951. Overdose can cause excessive bleeding.  What should I avoid while taking ticagrelor?  Drinking alcohol while taking aspirin can increase your risk of stomachbleeding.  Avoid activities that may increase your risk of bleeding or injury. Use extracare to prevent bleeding while shaving or brushing your teeth.  While taking ticagrelor with aspirin, avoid using medicines for pain, fever, swelling, or cold/flu symptoms. They may contain ingredients similar to aspirin (such as salicylates, ibuprofen, ketoprofen, or naproxen). Taking certain products together can cause you to get too much aspirin which can increase your risk of bleeding.  What are the possible side effects of ticagrelor?  Get emergency medical help if you have signs of an allergic reaction: hives; difficult breathing; swelling of your face, lips, tongue, or throat.  Call your doctor at once if you have:  . slow heartbeats;  . nosebleeds, or any bleeding that will not stop;  . shortness of breath even with mild exertion or while lying down;  . easy bruising, unusual bleeding, purple or red spots under your skin;  . red, pink, or brown urine;  Marland Kitchen  black, bloody, or tarry stools; or  . coughing up blood or vomit that looks like coffee grounds.  Common side effects may include:  . bleeding; or  . shortness of breath.  This is not a complete list of side effects and others may occur. Call your doctor for medical advice about side effects. You may report side effects toFDA at 1-800-FDA-1088.  What other drugs will affect ticagrelor?  Sometimes it is not safe to use certain medications at the same time. Some drugs can affect your blood levels of other drugs you take, which mayincrease side effects or make the  medications less effective.  Tell your doctor about all your current medicines. Many drugs can affect ticagrelor, especially:  . antifungal medicine;  . antiviral medicine to treat HIV or AIDS;  . a blood thinner;  . cholesterol medication;  . heart or blood pressure medication;  . opioid medication;  . seizure medicine; or  . tuberculosis medicine.  This list is not complete and many other drugs may affect ticagrelor. This includes prescription and over-the-counter medicines, vitamins, andherbal products. Not all possible drug interactions are listed here.  Where can I get more information?  Your pharmacist can provide more information about ticagrelor.  Remember, keep this and all other medicines out of the reach of children, never share your medicines with others, and use this medication only for the indication prescribed.   Every effort has been made to ensure that the information provided by [employer]') is accurate, up-to-date, and complete, but no guarantee is made to that effect. Drug information contained herein may be time sensitive. [employer] information has been compiled for use by healthcare practitioners and consumers in the [place] and therefore [employer] does not warrant that uses outside of the [place] are appropriate, unless specifically indicated otherwise. [employer]'s drug information does not endorse drugs, diagnose patients or recommend therapy. [employer]'s drug information is an [employer], corporate to assist licensed healthcare practitioners in caring for their patients and/or to serve consumers viewing this service as a supplement to, and not a substitute for, the expertise, skill, knowledge and judgment of healthcare practitioners. The absence of a warning for a given drug or drug combination in no way should be construed to indicate that the drug or drug combination is safe, effective or appropriate for any given patient. [employer] does not assume any  responsibility for any aspect of healthcare administered with the aid of information [employer] provides. The information contained herein is not intended to cover all possible uses, directions, precautions, warnings, drug interactions, allergic reactions, or adverse effects. If you have questions about the drugs you are taking, check with yourdoctor, nurse or pharmacist.  Copyright [phone] [employer] Version: 5.01. Revision date: 02/07/2019.  Care instructions adapted under license by [facility]. If you have questions about a medical condition or this instruction, always ask your healthcare professional. [employer] disclaims any warranty or liability for your use of this information.         nitroglycerin (oral/sublingual)  Pronunciation: NYE troe GLI ser in (OR al/sub LIN gwal)  Brand: GoNitro, Nitrolingual, Nitromist, Nitrostat, Nitro-Time  What is the most important information I should know about nitroglycerin?  Get emergency medical help if you still have chest pain after using a total of3 doses in 15 minutes, or if you have chest pain that seems unusual.  What is nitroglycerin?  Nitroglycerin is used to treat or prevent attacks of chest pain (angina).  Nitroglycerin may  also be used for purposes not listed in this medication guide.  What should I discuss with my healthcare provider before taking nitroglycerin?  You may not be able to use nitroglycerin if you have:  . severe anemia (low red blood cells);  . increased pressure inside the skull;  . circulation problems or shock (pale or clammy skin, cold sweat, numbness or tingling, fast or irregular heartbeats, or feeling like you might pass out); or  . if you also take riociguat or vericiguat.  Do not use erectile dysfunction medicine (such as Viagra, Cialis, or Levitra) or you could have a sudden and seriousdecrease in blood pressure.  Tell your doctor if you have ever had:  . an allergic reaction to nitroglycerin;  . a heart attack or  other heart problems; or  . low blood pressure.  Tell your doctor if you are pregnant or breastfeeding.  Not approved for use by anyone younger than 38 years old.  How should I take nitroglycerin?  Follow all directions on your prescription label and read all medication guides or instruction sheets. Use the medicine exactly as directed. Try to rest orstay seated when you take nitroglycerin (may cause dizziness or fainting).  Read and carefully follow any Instructions for Use provided with your medicine. Ask your doctor or pharmacist if you do not understand these instructions.  Use this medicine at the first sign of chest pain. Use another dose every as needed, up to a total of 3 doses.  Get emergency medical help if you still have chest pain after using a total of3 doses in 15 minutes, or if your chest pain seems unusual.  You may use nitroglycerin sublingual within 5 to 10 minutes before an activity that might cause chest pain.  This medicine can affect the results of certain medical tests. Tell any doctorwho treats you that you are using nitroglycerin.  If you take nitroglycerin on a regular schedule to prevent angina, do not stop taking it suddenly or you could have a severe attack of angina. Keep this medicine on hand at all times. Get your prescription refilled before you run out of medicine completely.  Store nitroglycerin at room temperature away from moisture and heat.  Keep the spray away from open flame or high heat, such as in a car on a hot day. The canistermay explode if it gets too hot.  What happens if I miss a dose?  If you take nitroglycerin on a regular schedule, skip any missed dose if it's almost time for your next dose. Do not use two doses at one time.  What happens if I overdose?  Seek emergency medical attention or call the Poison Help line at [phone]. An overdose of nitroglycerin can be fatal.  Overdose symptoms may include a throbbing headache, confusion, pounding  heartbeats, vision problems, vomiting, bloody diarrhea, sweating, clammy skin,blue lips, weak or shallow breathing, loss of movement, seizure, or fainting.  What should I avoid while taking nitroglycerin?  Avoid getting up too fast from a sitting or lying position, or you may feeldizzy.  Drinking alcohol can increase certain side effects such as dizziness,drowsiness, feeling light-headed, or fainting.  What are the possible side effects of nitroglycerin?  Get emergency medical help if you have signs of an allergic reaction: hives, sweating, pale skin; nausea, vomiting; feeling weak or light-headed;difficult breathing; swelling of your face, lips, tongue, or throat.  Call your doctor at once if you have:  . severe or throbbing headaches that do not  become less severe with continued use of nitroglycerin;  . pounding heartbeats or fluttering in your chest;  . slow heart rate;  . a light-headed feeling, like you might pass out;  . blurred vision or dry mouth; or  . heart attack symptoms --chest pain or pressure, pain spreading to your jaw or shoulder, nausea, sweating.  Nitroglycerin can cause severe headaches that should become less severe as youcontinue to use the medicine.  Common side effects may include:  . flushing (sudden warmth, redness, or tingly feeling);  . feeling light-headed, fainting;  . headache, dizziness; or  . numbness, tingling, burning pain.  This is not a complete list of side effects and others may occur. Call your doctor for medical advice about side effects. You may report side effects toFDA at 1-800-FDA-1088.  What other drugs will affect nitroglycerin?  Tell your doctor about all your current medicines, especially:  . aspirin, heparin;  . a diuretic (water pill);  . any medicine to treat erectile dysfunction;  . medicine to treat depression or psychiatric illness;  . medicine used to treat blood clots;  . heart or blood pressure medicine; or  . migraine headache medicine, such as ergot  medicine (dihydroergotamine, ergotamine, ergonovine, methylergonovine).  This list is not complete and many other drugs may affect nitroglycerin. This includes prescription and over-the-counter medicines, vitamins, andherbal products. Not all possible drug interactions are listed here.  Where can I get more information?  Your pharmacist can provide more information about nitroglycerin.  Remember, keep this and all other medicines out of the reach of children, never share your medicines with others, and use this medication only for the indication prescribed.   Every effort has been made to ensure that the information provided by [employer] ('[employer]') is accurate, up-to-date, and complete, but no guarantee is made to that effect. Drug information contained herein may be time sensitive. [employer] information has been compiled for use by healthcare practitioners and consumers in the [place] and therefore [employer] does not warrant that uses outside of the [place] are appropriate, unless specifically indicated otherwise. [employer]'s drug information does not endorse drugs, diagnose patients or recommend therapy. [employer]'s drug information is an Investment [employer], corporate to assist licensed healthcare practitioners in caring for their patients and/or to serve consumers viewing this service as a supplement to, and not a substitute for, the expertise, skill, knowledge and judgment of healthcare practitioners. The absence of a warning for a given drug or drug combination in no way should be construed to indicate that the drug or drug combination is safe, effective or appropriate for any given patient. [employer] does not assume any responsibility for any aspect of healthcare administered with the aid of information [employer] provides. The information contained herein is not intended to cover all possible uses, directions, precautions, warnings, drug interactions, allergic reactions, or adverse effects. If you have  questions about the drugs you are taking, check with yourdoctor, nurse or pharmacist.  Copyright [phone] [employer] Version: 16.01. Revision date: 06/20/2019.  Care instructions adapted under license by [facility]. If you have questions about a medical condition or this instruction, always ask your healthcare professional. [employer] disclaims any warranty or liability for your use of this information.         carvedilol  Pronunciation: KAR ve dil ole  Brand: Coreg, Coreg CR  What is the most important information I should know about carvedilol?  You should not take carvedilol if you have asthma, bronchitis,  emphysema, severe liver disease, or a serious heart condition such as heart block, "sicksinus syndrome," or slow heart rate (unless you have a pacemaker).  What is carvedilol?  Carvedilol is a beta-blocker that is used to treat heart failure andhypertension (high blood pressure).  Carvedilol is also used after a heart attack that has caused your heart not topump as well.  Carvedilol may also be used for purposes not listed in this medication guide.  What should I discuss with my healthcare provider before taking carvedilol?  You should not take carvedilol if you are allergic to it, or if you have:  . asthma, bronchitis, emphysema;  . severe liver disease; or  . a serious heart condition such as severe heart failure, heart block, "sick sinus syndrome," or slow heart rate (unless you have a pacemaker).  Tell your doctor if you have ever had:  . coronary artery disease (clogged arteries);  . slow heartbeats that have caused you to faint;  . fluid retention;  . asthma or other lung problems;  . angina (chest pain);  . diabetes (taking carvedilol can make it harder for you to tell when you have low blood sugar);  . a thyroid disorder;  . kidney disease;  . circulation problems (such as Raynaud's syndrome); or  . pheochromocytoma (tumor of the adrenal gland).  Tell your doctor if you are  pregnant or breastfeeding.  Carvedilol is not approved for use by anyone younger than 38 years old.  How should I take carvedilol?  Follow all directions on your prescription label and read all medication guides or instruction sheets. Your doctor may occasionally change your dose. Use themedicine exactly as directed.  Carvedilol works best if you take it with food, at the same time every day.  Swallow the extended-release capsule whole and do not crush, chew, break, or open it.  If you cannot swallow a capsule whole, open it and sprinkle the medicine into a spoonful of cold applesauce. Swallowthe mixture right away without chewing. Do not save it for later use.  If you are switched from carvedilol tablets to carvedilol extended-release capsules (Coreg CR), your daily total dose of this medicine may be higher or lower than before. Older adults may be more likely to become dizzy or feel faint when switching from tablets to extended-release capsules. Follow your doctor'sinstructions.  Your blood pressure will need to be checked often.  If you need surgery (including cataract surgery), tell your surgeon youcurrently use this medicine. You may need to stop for a short time.  You should not stop using carvedilol suddenly. Stopping suddenly may cause chest pain or a heart attack. Follow your doctor'sinstructions about tapering your dose.  If you are being treated for high blood pressure, keep using this medication even if you feel well. High blood pressure often has no symptoms. You may need to use blood pressuremedication for the rest of your life.  Carvedilol is only part of a complete treatment program that may also include diet, exercise, and weight control. Follow your doctor's instructions veryclosely.  Store at room temperature away from moisture and heat.  What happens if I miss a dose?  Take the medicine as soon as you can, but skip the missed dose if it is almost time for your next dose. Do not take two doses at  one time.  What happens if I overdose?  Seek emergency medical attention or call the Poison Help line at [phone].  Overdose symptoms may include uneven heartbeats, shortness of  breath, bluish-colored fingernails, dizziness, weakness, fainting, and seizure(convulsions).  What should I avoid while taking carvedilol?  Avoid driving or hazardous activity until you know how this medicine will affect you. Your reactions could be impaired. Avoid getting up too fast from asitting or lying position, or you may feel dizzy.  What are the possible side effects of carvedilol?  Get emergency medical help if you have signs of an allergic reaction: hives; difficulty breathing; swelling of your face, lips, tongue, or throat.  Call your doctor at once if you have:  . a light-headed feeling, like you might pass out;  . slow or uneven heartbeats;  . cold feeling or numbness in your fingers or toes;  . chest pain, dry cough, wheezing, chest tightness;  . heart problems --swelling, rapid weight gain, feeling short of breath; or  . high blood sugar --increased thirst, increased urination, dry mouth, fruity breath odor.  Common side effects may include:  . dizziness;  . slow heartbeats;  . diarrhea;  . weight gain;  . dry eyes; or  . problems wearing contact lenses.  This is not a complete list of side effects and others may occur. Call your doctor for medical advice about side effects. You may report side effects toFDA at 1-800-FDA-1088.  What other drugs will affect carvedilol?  Sometimes it is not safe to use certain medications at the same time. Some drugs can affect your blood levels of other drugs you take, which mayincrease side effects or make the medications less effective.  Other drugs may affect carvedilol, including prescription and over-the-counter medicines, vitamins, and herbal products. Tell your doctor about all yourcurrent medicines and any medicine you start or stop using.  Where can I get more information?  Your  pharmacist can provide more information about carvedilol.  Remember, keep this and all other medicines out of the reach of children, never share your medicines with others, and use this medication only for the indication prescribed.   Every effort has been made to ensure that the information provided by [employer]') is accurate, up-to-date, and complete, but no guarantee is made to that effect. Drug information contained herein may be time sensitive. [employer] information has been compiled for use by healthcare practitioners and consumers in the [place] and therefore [employer] does not warrant that uses outside of the [place] are appropriate, unless specifically indicated otherwise. [employer]'s drug information does not endorse drugs, diagnose patients or recommend therapy. [employer]'s drug information is an Investment banker, corporate to assist licensed healthcare practitioners in caring for their patients and/or to serve consumers viewing this service as a supplement to, and not a substitute for, the expertise, skill, knowledge and judgment of healthcare practitioners. The absence of a warning for a given drug or drug combination in no way should be construed to indicate that the drug or drug combination is safe, effective or appropriate for any given patient. [employer] does not assume any responsibility for any aspect of healthcare administered with the aid of information [employer] provides. The information contained herein is not intended to cover all possible uses, directions, precautions, warnings, drug interactions, allergic reactions, or adverse effects. If you have questions about the drugs you are taking, check with yourdoctor, nurse or pharmacist.  Copyright [phone] [employer] Version: 16.01. Revision date:05/10/2017.  Care instructions adapted under license by [facility]. If you have questions about a medical condition or this instruction, always ask your healthcare  professional. [employer] disclaims any warranty or  liability for your use of this information.         atorvastatin  Pronunciation: a TOR va sta tin  Brand: Lipitor  What is the most important information I should know about atorvastatin?  You should not take atorvastatin if you are pregnant or breastfeeding, or ifyou have liver disease.  Tell your doctor about all your current medicines and any you start or stop using. Many drugs can interact, and some drugs should not be used together.  Atorvastatin can cause the breakdown of muscle tissue, which can lead to kidney failure. Call your doctor right away if you have unexplained muscle pain, tenderness, or weakness especially if you also have fever, unusual tiredness,or dark urine.  What is atorvastatin?  Atorvastatin is used together with diet to lower blood levels of "bad" cholesterol (low-density lipoprotein, or LDL), to increase levels of "good" cholesterol (high-density lipoprotein, or HDL), and to lower triglycerides (atype of fat in the blood).  Atorvastatin is used to treat high cholesterol, and to lower the risk of stroke, heart attack, or other heart complications in people with type 2diabetes, coronary heart disease, or other risk factors.  Atorvastatin is used in adults and children who are at least 42 years old.  Atorvastatin may also be used for purposes not listed in this medication guide.  What should I discuss with my healthcare provider before taking atorvastatin?  You should not use atorvastatin if you are allergic to it, or if you have liverdisease.  Do not use if you are pregnant. This medicine can harm an unborn baby. Use effective birth control to prevent pregnancy. Stop taking this medicine and tell your doctor at once if you becomepregnant.  Do not breastfeed while you are taking atorvastatin.  Tell your doctor if you have ever had:  . liver problems;  . muscle pain or weakness;  . kidney disease;  . diabetes;  . a thyroid  disorder; or  . if you drink more than 2 alcoholic beverages daily.  Atorvastatin can cause the breakdown of muscle tissue, which can lead to kidney failure. This happens more often in women, in older adults, or people who have kidneydisease or poorly controlled hypothyroidism (underactive thyroid).  Atorvastatin is not approved for use by anyone younger than 38 years old.  How should I take atorvastatin?  Follow all directions on your prescription label and read all medication guides or instruction sheets. Your doctor may occasionally change your dose. Use themedicine exactly as directed.  Take the medicine at the same time each day, with or without food.  Do not break an atorvastatin tablet before taking it, unless your doctor has told you to.  You may need to stop using atorvastatin for a short time if you have:  . uncontrolled seizures;  . an electrolyte imbalance (such as high or low potassium levels in your blood);  . severely low blood pressure;  . a severe infection or illness; or  . surgery or a medical emergency.  It may take up to 2 weeks before your cholesterol levels improve, and you may need frequent blood tests. Even if you have no symptoms, tests can help yourdoctor determine if this medicine is effective.  Atorvastatin is only part of a complete treatment program that may also include diet, exercise, and weight control. Follow your doctor's instructions veryclosely.  Store at room temperature away from moisture, heat, and light.  What happens if I miss a dose?  Use the medicine as soon as you can,  but skip the missed dose if you are more than 12 hours late for the dose. Do not use two doses at one time.  What happens if I overdose?  Seek emergency medical attention or call the Poison Help line at [phone].  What should I avoid while taking atorvastatin?  Avoid eating foods high in fat or cholesterol, or atorvastatin will not be aseffective.  Avoid drinking alcohol. It can raise triglyceride  levels and may increase yourrisk of liver damage.  Grapefruit may interact with atorvastatin and lead to unwanted side effects. Avoid drinking more than 1 liter of grapefruit juice while taking atorvastatin.  What are the possible side effects of atorvastatin?  Get emergency medical help if you have signs of an allergic reaction: hives; difficulty breathing; swelling of your face, lips, tongue, or throat.  In rare cases, atorvastatin can cause a condition that results in the breakdown of skeletal muscle tissue, leading to kidney failure. Call your doctor right away if you have unexplained muscle pain, tenderness, or weakness especially ifyou also have fever, unusual tiredness, and dark colored urine.  Also call your doctor at once if you have:  . muscle weakness in your hips, shoulders, neck, and back;  . trouble lifting your arms, trouble climbing or standing;  . liver problems --upper stomach pain, weakness, tired feeling, loss of appetite, dark urine, jaundice (yellowing of the skin or eyes); or  . kidney problems --little or no urinating, swelling in your feet or ankles, feeling tired or short of breath.  Common side effects may include:  . joint pain;  . stuffy nose, sore throat;  . diarrhea; or  . pain in your arms or legs.  This is not a complete list of side effects and others may occur. Call your doctor for medical advice about side effects. You may report side effects toFDA at 1-800-FDA-1088.  What other drugs will affect atorvastatin?  Certain other drugs can increase your risk of serious muscle problems, and it is very important that your doctor knows if you are using any of them. Tell your doctor about all your current medicines and any you start or stopusing, especially:  . other cholesterol-lowering medication;  . antibiotic or antifungal medicine;  . birth control pills;  . medicine to prevent organ transplant rejection;  . heart medication; or  . medicine to treat hepatitis C or HIV.  This list is  not complete and many other drugs may affect atorvastatin. This includes prescription and over-the-counter medicines, vitamins, andherbal products. Not all possible drug interactions are listed here.  Where can I get more information?  Your pharmacist can provide more information about atorvastatin.  Remember, keep this and all other medicines out of the reach of children, never share your medicines with others, and use this medication only for the indication prescribed.   Every effort has been made to ensure that the information provided by [employer]') is accurate, up-to-date, and complete, but no guarantee is made to that effect. Drug information contained herein may be time sensitive. [employer] information has been compiled for use by healthcare practitioners and consumers in the [place] and therefore [employer] does not warrant that uses outside of the [place] are appropriate, unless specifically indicated otherwise. [employer]'s drug information does not endorse drugs, diagnose patients or recommend therapy. [employer]'s drug information is an [employer] forces technical officer designed to assist licensed healthcare practitioners in caring for their patients and/or to serve consumers viewing this service as a supplement to, and  not a substitute for, the expertise, skill, knowledge and judgment of healthcare practitioners. The absence of a warning for a given drug or drug combination in no way should be construed to indicate that the drug or drug combination is safe, effective or appropriate for any given patient. [employer] does not assume any responsibility for any aspect of healthcare administered with the aid of information [employer] provides. The information contained herein is not intended to cover all possible uses, directions, precautions, warnings, drug interactions, allergic reactions, or adverse effects. If you have questions about the drugs you are taking, check with yourdoctor, nurse or  pharmacist.  Copyright [phone] [employer] Version: 22.02. Revision date: 02/25/2019.  Care instructions adapted under license by [facility]. If you have questions about a medical condition or this instruction, always ask your healthcare professional. [employer] disclaims any warranty or liability for your use of this information.         Learning About Coronary Artery Disease (CAD)  What is coronary artery disease?     Coronary artery disease is a condition that occurs when plaque builds up in the arteries that bring oxygen-rich blood to your heart. Plaque is a fatty substance made of cholesterol, calcium, and other substances in the blood. Thisprocess is called hardening of the arteries, or atherosclerosis.  What happens when you have coronary artery disease?  . Plaque may narrow the coronary arteries. Narrowed arteries cause poor blood flow. This can lead to angina symptoms such as chest pain or discomfort. If blood flow is completely blocked, you could have a heart attack.  . You can slow and reduce the risk of future problems by making changes in your lifestyle. These include quitting smoking and eating heart-healthy foods.  . Treatment, along with changes in your lifestyle, can help you live a longer and healthier life.  How can you prevent coronary artery disease?  Marland Kitchen. Do not smoke. It may be the best thing you can do to prevent coronary artery disease. If you need help quitting, talk to your doctor about stop-smoking programs and medicines. These can increase your chances of quitting for good.  . Be active. Try to do moderate activity at least 2 hours a week. Or try to do vigorous activity at least 1 hours a week. You may want to walk or try other activities, such as running, swimming, cycling, or playing tennis or team sports.  . Eat heart-healthy foods. Eat more fruits and vegetables and less food that contains saturated and trans fats. Limit alcohol, sodium, and sweets.  . Stay at  a healthy weight. Lose weight if you need to.  . Manage other health problems such as diabetes, high blood pressure, and high cholesterol.  How is coronary artery disease treated?  Marland Kitchen. Your doctor will suggest that you make lifestyle changes. For example, your doctor may ask you to eat healthy foods, quit smoking, lose extra weight, and be more active.  . You will take medicines that help prevent a heart attack.  . Your doctor may suggest a procedure to open narrowed or blocked arteries. This is called angioplasty. Or your doctor may suggest using healthy blood vessels to create detours around narrowed or blocked arteries. This is called bypass surgery.  Follow-up care is a key part of your treatment and safety. Be sure to make and go to all appointments, and call your doctor if you are having problems. It's also a good idea to know your test results and keep alist of  the medicines you take.  Where can you learn more?  Go to [email or web address] and sign in to your MyChart account. Enter C643 in the Search Health Information box to learn more about "Learning About Coronary Artery Disease (CAD)."     If you do not have an account, please click on the "Sign Up Now" link.  Current as of: January 10, 2022Content Version: 13.3   2006-2022 [employer].   Care instructions adapted under license by [facility]. If you have questions about a medical condition or this instruction, always ask your healthcare professional. [employer] disclaims any warranty or liability for your use of this information.

## 2020-07-15 NOTE — Care Coordination-Inpatient (Signed)
Pt presented to the ED c/o chest pain. Pt reported having stent placed 6 months ago in [place] but had only been taking his aspirin. EKG showed t wave inversion concerning for ischemia. Pt underwent cardiac catheterization by Dr. 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 Pt tolerated the procedure well and is now discharging home in stable condition. Pt denies having a PCP and is self-pay. CM vouched the following medications ($104.20):    Lipitor 41m #30-$30.26  Diovan 87m#30-$33.95  Nitrostat 0.40m11m25-$9.90  Coreg 3.125m48m0-$30.09  Brilinta 90mg77m-NOT COVERED due to cost, 30-day free coupon and Pt Assistance Application provided to pt    CM also provided pt resources for [facility] and [employer] application for future medications. No other discharge needs were identified. Tx goals met.       07/15/20 1307 [place] None   [employer] Provided? No   Mode of Transport at Discharge Other (see comment)  (Family)   Confirm Follow Up Transport Self   Condition of Participation: Discharge Planning   The Patient and/or Patient Representative was provided with a Choice of Provider? Patient   The Patient and/Or Patient Representative agree with the Discharge Plan? Yes   Freedom of Choice list was provided with basic dialogue that supports the patient's individualized plan of care/goals, treatment preferences, and shares the quality data associated with the providers?  Yes

## 2020-07-15 NOTE — Progress Notes (Signed)
[facility] PROGRESS NOTE           07/15/2020 8:51 AM    Admit Date: 07/14/2020      Subjective:     No overnight events.  Denies any chest discomfort.    ROS:  Cardiovascular:  As noted above    Objective:      Vitals:    07/15/20 0439 07/15/20 0600 07/15/20 0800 07/15/20 0815   BP: (!) 154/99 137/84 (!) 152/98 (!) 151/95   Pulse: 66  65 67   Resp: 18  18    Temp: 97.9 ??F (36.6 ??C)  98.5 ??F (36.9 ??C)    TempSrc:   Oral    SpO2: 98%  98%    Weight: 237 lb (107.5 kg)      Height:           Physical Exam:  General-No Acute Distress  Neck- supple, no JVD  CV- regular rate and rhythm no MRG  Lung- clear bilaterally  Abd- soft, nontender, nondistended  Ext- no edema bilaterally.  Skin- warm and dry    Data Review:   Recent Labs     07/14/20  0751 07/14/20  0751 07/14/20  1305 07/15/20  0309   NA 139  --   --  140   K 4.4  --   --  3.9   MG  --   --   --  2.0   BUN 13  --   --  13   WBC 6.9  --   --  7.4   HGB 15.9   < > 14.8 14.8   HCT 47.3  --   --  42.3   PLT 302  --   --  272   INR 0.9  --   --   --     < > = values in this interval not displayed.       Assessment/Plan:     Principal Problem:    NSTEMI (non-ST elevated myocardial infarction) (HCC)  -s/p PCI to mRCA (stent thrombosis secondary to on expanded stent); other anatomy with mild nonobstructive disease; prior PCI ~6 mos ago; appears stopped taking his Plavix around 03/2020 as he ran out of his meds (states he was told did not need it anymore); prior stenting appears ACS related from 11/2019 in [place]  -Echo with low normal EF with inferior/inferolateral wall motion abnormalities  -Stressed dual antiplatelet compliance; needs 1 year uninterrupted  -Continue high intensity statin.  -Continue Coreg/valsartan; started during hospital stay.  Titrate as needed    Active Problems:    Hypertension  -Titrate therapy as needed; target less than 130/80      CAD (coronary artery disease)  -See above.    Disposition-plan for home today with close  outpatient follow-up    Lenna Gilford, MD  07/15/2020 8:51 AM

## 2020-07-15 NOTE — Progress Notes (Signed)
Cardiac Rehab: Spoke with patient regarding referral to cardiac rehab. Patient meets admission criteria based on NSTEMI with PCI (07/14/20).  Written information about Cardiac Rehab given and reviewed with patient. Discussed lifestyle modifications to promote cardiac wellness. Patient indicated that he does not want to participate in the cardiac rehab program due to financial concerns. He has an application to the [employer] and will call to schedule his orientation if he decides he would like to participate.  His Cardiologist is Dr. Theressa Millard.      Thank you,  Cheri Kearns, BSN, RN  Cardiopulmonary Rehabilitation Nurse Liaison  [facility]

## 2020-07-16 NOTE — Telephone Encounter (Signed)
Called patient to schedule TCV appt following discharge from hospital 07/15/2020.  Dx NSTEMI (non-ST elevated myocardial infarction    Unable to leave message VM full    Patient has appt 07/29/2020 with [facility].

## 2020-07-16 NOTE — Telephone Encounter (Signed)
Attempted to make transitions of care call - no answer at phone and mailbox is full and cannot make message.  Will try again.

## 2020-07-20 NOTE — Telephone Encounter (Signed)
Transitional Care fu after DC from [facility] 07/15/20.I spoke w/pt.he is doing well.He has all his meds and cath site looks good w/no signs of infection.I went over DC summary including post cath care,s/s of infection,importance of med compliance,dual antiplatelet therapy for a year,diet,activity and fu appt.7/14.All questions answered and pt.advised to call PRN and he v/u.

## 2020-07-23 NOTE — Telephone Encounter (Signed)
Unable to contact patient

## 2020-07-29 ENCOUNTER — Ambulatory Visit: Admit: 2020-07-29 | Discharge: 2020-07-29 | Attending: Cardiovascular Disease | Primary: Sports Medicine

## 2020-07-29 DIAGNOSIS — I10 Essential (primary) hypertension: Secondary | ICD-10-CM

## 2020-07-29 MED ORDER — VALSARTAN 80 MG PO TABS
80 MG | ORAL_TABLET | Freq: Every day | ORAL | 5 refills | Status: DC
Start: 2020-07-29 — End: 2021-02-18

## 2020-07-29 MED ORDER — CARVEDILOL 3.125 MG PO TABS
3.125 MG | ORAL_TABLET | Freq: Two times a day (BID) | ORAL | 5 refills | Status: DC
Start: 2020-07-29 — End: 2021-02-18

## 2020-07-29 MED ORDER — TICAGRELOR 90 MG PO TABS
90 MG | ORAL_TABLET | Freq: Two times a day (BID) | ORAL | 5 refills | Status: DC
Start: 2020-07-29 — End: 2020-12-07

## 2020-07-29 MED ORDER — ATORVASTATIN CALCIUM 40 MG PO TABS
40 MG | ORAL_TABLET | Freq: Every evening | ORAL | 5 refills | Status: DC
Start: 2020-07-29 — End: 2021-02-18

## 2020-07-29 NOTE — Progress Notes (Signed)
[facility]  [address], [address]  [place], [place] [address]  PHONE: [phone]    Daelyn Mozer  07/25/1980      SUBJECTIVE:   Christopher Downs is a 40 y.o. male seen for a follow up visit regarding the following:     Chief Complaint   Patient presents with   ??? Follow-Up from Hospital     LHC       HPI:    Presents for follow-up.  Recently admitted with a myocardial infarction.  Had stent thrombosis from a unopposed stent in his RCA.  Underwent mechanical thrombectomy, PCI and IVUS.  Did well.  Overall preserved LV function.  Has had some muscular pain with laying on his side but no chest pain consistent with angina.  States he is compliant with his medication.  He had stopped his antiplatelet therapy prior to his hospitalization.      Past Medical History, Past Surgical History, Family history, Social History, and Medications were all reviewed with the patient today and updated as necessary.           Current Outpatient Medications:   ???  valsartan (DIOVAN) 80 MG tablet, Take 1 tablet by mouth daily, Disp: 30 tablet, Rfl: 5  ???  ticagrelor (BRILINTA) 90 MG TABS tablet, Take 1 tablet by mouth 2 times daily, Disp: 60 tablet, Rfl: 5  ???  carvedilol (COREG) 3.125 MG tablet, Take 1 tablet by mouth 2 times daily (with meals), Disp: 60 tablet, Rfl: 5  ???  atorvastatin (LIPITOR) 40 MG tablet, Take 1 tablet by mouth nightly, Disp: 30 tablet, Rfl: 5  ???  nitroGLYCERIN (NITROSTAT) 0.4 MG SL tablet, up to max of 3 total doses. If no relief after 1 dose, call 911., Disp: 25 tablet, Rfl: 3  ???  aspirin 81 MG chewable tablet, Take 81 mg by mouth daily, Disp: , Rfl:      No Known Allergies     Patient Active Problem List    Diagnosis Date Noted   ??? NSTEMI (non-ST elevated myocardial infarction) (HCC) 07/14/2020     Priority: High     1.  NSTEMI 12/01/19:  4 x 15 mm DES.   2.  NSTEMI 07/14/20:  PCI of RCA using a 4.5 x 26 mm resolute Onyx drug-eluting stent.  Prior stent was underexpanded.  Postdilated to 5.0 mm.   Post interventional IVUS.  All disease in other segments.  EF 45-50% inferior akinesis  3.  Echo (07/14/20):  EF 50-55%.  Inferior HK.  Normal diastolic function.  Mild MR.      ??? Hypertension 07/14/2020     Priority: High   ??? CAD (coronary artery disease) 07/14/2020     Priority: High   ??? Hyperglycemia 07/14/2020     Priority: High   ??? Mixed hyperlipidemia 07/29/2020     Priority: Medium        Social History     Tobacco Use   ??? Smoking status: Never Smoker   ??? Smokeless tobacco: Never Used   Substance Use Topics   ??? Alcohol use: Not on file       ROS:    Review of Systems   Constitutional: Negative for malaise/fatigue.   Cardiovascular: Negative for chest pain.   Respiratory: Negative for shortness of breath.    Musculoskeletal: Negative for arthritis.   Neurological: Negative for focal weakness.   Psychiatric/Behavioral: Negative for depression.           PHYSICAL EXAM:  Wt  Readings from Last 3 Encounters:   07/29/20 241 lb (109.3 kg)   07/15/20 237 lb (107.5 kg)   07/14/20 235 lb (106.6 kg)     BP Readings from Last 3 Encounters:   07/29/20 118/80   07/15/20 (!) 148/86   07/14/20 (!) 148/89     Pulse Readings from Last 3 Encounters:   07/29/20 72   07/15/20 67   07/14/20 66       Physical Exam  Constitutional:       General: He is not in acute distress.  Neck:      Vascular: No carotid bruit.   Cardiovascular:      Rate and Rhythm: Normal rate and regular rhythm.   Pulmonary:      Effort: No respiratory distress.      Breath sounds: Normal breath sounds.   Abdominal:      General: Bowel sounds are normal.      Palpations: Abdomen is soft.   Musculoskeletal:         General: No swelling.   Skin:     General: Skin is warm and dry.   Neurological:      General: No focal deficit present.   Psychiatric:         Mood and Affect: Mood normal.         Medical problems and test results were reviewed with the patient today.       Lab Results   Component Value Date    WBC 7.4 07/15/2020    HGB 14.8 07/15/2020    HCT 42.3  07/15/2020    MCV 86.3 07/15/2020    PLT 272 07/15/2020       Lab Results   Component Value Date/Time    NA 140 07/15/2020 03:09 AM    K 3.9 07/15/2020 03:09 AM    CL 108 07/15/2020 03:09 AM    CO2 25 07/15/2020 03:09 AM    BUN 13 07/15/2020 03:09 AM    CREATININE 1.20 07/15/2020 03:09 AM    GLUCOSE 131 07/15/2020 03:09 AM    CALCIUM 8.6 07/15/2020 03:09 AM        Lab Results   Component Value Date    CHOL 227 (H) 07/15/2020     Lab Results   Component Value Date    TRIG 566 (H) 07/15/2020     Lab Results   Component Value Date    HDL 36 (L) 07/15/2020     Lab Results   Component Value Date    [facility] Not calculated due to elevated triglyceride level 07/15/2020     Lab Results   Component Value Date    LABVLDL 113.2 (H) 07/15/2020     Lab Results   Component Value Date    CHOLHDLRATIO 6.3 07/15/2020        Data from outside records/labs from outside providers have been reviewed and summarized as noted in the HPI, past history and data review sections of this note       ASSESSMENT and PLAN      1. NSTEMI (non-ST elevated myocardial infarction) [facility])  Patient is stable after recent admission with myocardial infarction requiring revascularization with stent placement. No recurrent angina.  Discussed importance of compliance with dual anti-platelet therapy.  Continue Aspirin and Brilinta.   Discussed risk of stent thrombosis, myocardial infarction and death if non-compliant. We also discussed the importance of continued risk factor modification for prevention of future cardiovascular events.  The patient is encouraged to contact my  office with any worsening dyspnea or chest discomfort.    2. Primary hypertension  BP controlled. Continue Coreg and Diovan.     3. Mixed hyperlipidemia  Continue Lipitor.                    Ebony Hail, MD  07/29/2020  5:14 PM    This note may have been dictated using speech recognition software.  As a result, error of speech recognition may have occurred

## 2020-08-05 ENCOUNTER — Encounter: Attending: Cardiovascular Disease | Primary: Sports Medicine

## 2020-08-05 MED FILL — MIDAZOLAM HCL 2 MG/2ML IJ SOLN: 2 mg/mL | INTRAMUSCULAR | Qty: 2

## 2020-08-26 NOTE — Telephone Encounter (Signed)
Patient called stating that Brilinta is over $400. He cannot afford this. Patient states that he filled out patient assistance forms at the hospital. He has reached out to the hospital, but has not heard back. I offered patient samples and if he would refill out the paper work, bring it back in I would make sure that it would get faxed. Patient agreed and will come by for samples and paperwork for PA. Patient thanked me//brendab

## 2020-08-26 NOTE — Telephone Encounter (Signed)
Patient called and said he was not going to be able to afford his Brilinta 90 MG. Patient wants a call back to discuss if there is an alternative medication or a program that can help him be able to get the Brilinta. Please call patient because he stated he only has 2 days medication left in prescription bottle.

## 2020-08-31 NOTE — Telephone Encounter (Signed)
Received patient assistance form for Brilinta filled out by patient. Will have Dr. Theressa Millard sign, fax back to [phone]//brendab

## 2020-09-01 NOTE — Telephone Encounter (Signed)
Faxed PA for Brilinta to [employer] at [phone] patient notified//Lakeitha Basques

## 2020-09-16 ENCOUNTER — Ambulatory Visit: Admit: 2020-09-16 | Discharge: 2020-09-16 | Attending: Cardiovascular Disease | Primary: Sports Medicine

## 2020-09-16 DIAGNOSIS — I2510 Atherosclerotic heart disease of native coronary artery without angina pectoris: Secondary | ICD-10-CM

## 2020-09-16 MED ORDER — CLOPIDOGREL BISULFATE 75 MG PO TABS
75 MG | ORAL_TABLET | Freq: Every day | ORAL | 1 refills | Status: DC
Start: 2020-09-16 — End: 2021-02-18

## 2020-09-16 NOTE — Progress Notes (Signed)
[facility]  [address], [address]  [place], [place] [address]  PHONE: [phone]    Darcell Sabino  [birth year]      SUBJECTIVE:   Iaan Oregel is a 40 y.o. male seen for a follow up visit regarding the following:     Chief Complaint   Patient presents with    Hypertension    Follow-Up from Hospital     Heart cath       HPI:    Laurent Cargile presents for routine follow up for known Coronary Artery Disease. Multiple issues addressed as outlined below:     Coronary Artery Disease:  Patient denies any recent angina.  Notes compliance with medical therapy.  No recent NTG use.  No intolerance to anti-platelet therapy. Notes excessive cost with Brilinta.     Hypertension:  Ambulatory BP readings have been controlled.  Patient reports compliance with medical therapy without side effects.      Hyperlipidemia:   Tolerating Lipitor.           Past Medical History, Past Surgical History, Family history, Social History, and Medications were all reviewed with the patient today and updated as necessary.           Current Outpatient Medications:     clopidogrel (PLAVIX) 75 MG tablet, Take 1 tablet by mouth daily, Disp: 90 tablet, Rfl: 1    valsartan (DIOVAN) 80 MG tablet, Take 1 tablet by mouth daily, Disp: 30 tablet, Rfl: 5    ticagrelor (BRILINTA) 90 MG TABS tablet, Take 1 tablet by mouth 2 times daily, Disp: 60 tablet, Rfl: 5    carvedilol (COREG) 3.125 MG tablet, Take 1 tablet by mouth 2 times daily (with meals), Disp: 60 tablet, Rfl: 5    atorvastatin (LIPITOR) 40 MG tablet, Take 1 tablet by mouth nightly, Disp: 30 tablet, Rfl: 5    nitroGLYCERIN (NITROSTAT) 0.4 MG SL tablet, up to max of 3 total doses. If no relief after 1 dose, call 911., Disp: 25 tablet, Rfl: 3    aspirin 81 MG chewable tablet, Take 81 mg by mouth daily, Disp: , Rfl:      No Known Allergies     Patient Active Problem List    Diagnosis Date Noted    Coronary artery disease involving native coronary artery of native heart 07/14/2020      Priority: High     1.  NSTEMI 12/01/19:  4 x 15 mm DES.   2.  NSTEMI 07/14/20:  PCI of RCA using a 4.5 x 26 mm resolute Onyx drug-eluting stent.  Prior stent was underexpanded.  Postdilated to 5.0 mm.  Post interventional IVUS.  All disease in other segments.  EF 45-50% inferior akinesis  3.  Echo (07/14/20):  EF 50-55%.  Inferior HK.  Normal diastolic function.  Mild MR.       Hypertension 07/14/2020     Priority: High    Hyperglycemia 07/14/2020     Priority: High    Mixed hyperlipidemia 07/29/2020     Priority: Medium        Social History     Tobacco Use    Smoking status: Never    Smokeless tobacco: Never   Substance Use Topics    Alcohol use: Not on file       ROS:    Review of Systems   Constitutional: Negative for malaise/fatigue.   Cardiovascular:  Negative for chest pain.   Respiratory:  Negative for shortness of breath.  Musculoskeletal:  Positive for arthritis.   Neurological:  Negative for focal weakness.   Psychiatric/Behavioral:  Negative for depression.          PHYSICAL EXAM:  Wt Readings from Last 3 Encounters:   09/16/20 247 lb 9.6 oz (112.3 kg)   07/29/20 241 lb (109.3 kg)   07/15/20 237 lb (107.5 kg)     BP Readings from Last 3 Encounters:   09/16/20 118/88   07/29/20 118/80   07/15/20 (!) 148/86     Pulse Readings from Last 3 Encounters:   09/16/20 76   07/29/20 72   07/15/20 67       Physical Exam  Constitutional:       General: He is not in acute distress.  Neck:      Vascular: No carotid bruit.   Cardiovascular:      Rate and Rhythm: Normal rate and regular rhythm.   Pulmonary:      Effort: No respiratory distress.      Breath sounds: Normal breath sounds.   Abdominal:      General: Bowel sounds are normal.      Palpations: Abdomen is soft.   Musculoskeletal:         General: No swelling.   Skin:     General: Skin is warm and dry.   Neurological:      General: No focal deficit present.   Psychiatric:         Mood and Affect: Mood normal.       Medical problems and test results were  reviewed with the patient today.       Lab Results   Component Value Date    WBC 7.4 07/15/2020    HGB 14.8 07/15/2020    HCT 42.3 07/15/2020    MCV 86.3 07/15/2020    PLT 272 07/15/2020       Lab Results   Component Value Date/Time    NA 140 07/15/2020 03:09 AM    K 3.9 07/15/2020 03:09 AM    CL 108 07/15/2020 03:09 AM    CO2 25 07/15/2020 03:09 AM    BUN 13 07/15/2020 03:09 AM    CREATININE 1.20 07/15/2020 03:09 AM    GLUCOSE 131 07/15/2020 03:09 AM    CALCIUM 8.6 07/15/2020 03:09 AM        Lab Results   Component Value Date    CHOL 227 (H) 07/15/2020     Lab Results   Component Value Date    TRIG 566 (H) 07/15/2020     Lab Results   Component Value Date    HDL 36 (L) 07/15/2020     Lab Results   Component Value Date    [facility] Not calculated due to elevated triglyceride level 07/15/2020     Lab Results   Component Value Date    LABVLDL 113.2 (H) 07/15/2020     Lab Results   Component Value Date    CHOLHDLRATIO 6.3 07/15/2020        Data from outside records/labs from outside providers have been reviewed and summarized as noted in the HPI, past history and data review sections of this note       ASSESSMENT and PLAN      1. Coronary artery disease involving native coronary artery of native heart without angina pectoris  Patient without any anginal symptoms.  Given a prescription of Plavix to take if he cannot get Brilinta for an affordable rate.  We have requested patient assistance for  him.  Continue aspirin daily.    2. Primary hypertension  Blood pressure well controlled.  Continue carvedilol and Diovan.    3. Mixed hyperlipidemia  Continue Lipitor.                Ebony Hail, MD  09/16/2020  9:34 AM    This note may have been dictated using speech recognition software.  As a result, error of speech recognition may have occurred

## 2020-09-22 NOTE — Telephone Encounter (Signed)
Patient notified of fax requesting income. Phone number given for patient to call. Patient voiced understanding//brendab

## 2020-09-22 NOTE — Telephone Encounter (Signed)
Received faxed notice from [employer] in regards to PA for Brilinta. Needs to have patient income documentation. Tried to reach patient, no answer mailbox is full. Will try again later//brendab

## 2020-09-24 NOTE — Telephone Encounter (Signed)
Patient called and informed that [employer] application status was denied. Patient states that he called [employer], was given the [facility] [employer] Medicaid number at [phone] I also gave him [employer] [phone]. Patient voiced understanding//brendab

## 2020-09-24 NOTE — Telephone Encounter (Signed)
error 

## 2020-09-24 NOTE — Telephone Encounter (Signed)
Received faxed notice that  Shipment of medication for Brilinta is being shipped//Christopher Downs

## 2020-10-06 NOTE — ED Notes (Signed)
ED Notes               pt up for dc at this time.  This rn and Crystal,rn talked to Dr. Melvyn Neth about dc plan and to make sure no ekg/blood work needed to be done considering pt has cardiac hx. Dr. Melvyn Neth fine with dc'ing pt since symptoms have resolved. States to monitor pt for 20 minutes to see if bp goes down. Explained to pt the plan and if he was okay with going home. Pt states that he is fine with that and understands if he has these s/s again he can come back to ER.  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     [employer]

## 2020-10-06 NOTE — ED Notes (Signed)
ED Triage Note       ED Secondary Triage Entered On:  10/06/2020 1:57 EDT    Performed On:  10/06/2020 1:25 EDT by Michela Pitcher C-RN               General Information   Barriers to Learning :   None evident   ED Home Meds Section :   Document assessment   [facility] ED Fall Risk Section :   Document assessment   ED Advance Directives Section :   Document assessment   ED Palliative Screen :   N/A (prefilled for <38yo)   Michela Pitcher C-RN - 10/06/2020 1:56 EDT   (As Of: 10/06/2020 01:57:13 EDT)   Problems(Active)    MI (myocardial infarction) (SNOMED CT  :29562130 )  Name of Problem:   MI (myocardial infarction) ; Recorder:   Hazel Sams; Confirmation:   Confirmed ; Classification:   Patient Stated ; Code:   [id number] ; Contributor System:   PowerChart ; Last Updated:   10/06/2020 1:21 EDT ; Life Cycle Date:   10/06/2020 ; Life Cycle Status:   Active ; Vocabulary:   SNOMED CT        Stented coronary artery (SNOMED CT  :6295284132 )  Name of Problem:   Stented coronary artery ; Recorder:   Hazel Sams; Confirmation:   Confirmed ; Classification:   Patient Stated ; Code:   [id number] ; Contributor System:   PowerChart ; Last Updated:   10/06/2020 1:21 EDT ; Life Cycle Date:   10/06/2020 ; Life Cycle Status:   Active ; Vocabulary:   SNOMED CT          Diagnoses(Active)    Heat exposure  Date:   10/06/2020 ; Diagnosis Type:   Discharge ; Confirmation:   Confirmed ; Clinical Dx:   Heat exposure ; Classification:   Medical ; Clinical Service:   Non-Specified ; Code:   ICD-10-CM ; Probability:   0 ; Diagnosis Code:   T67.9XXA             -    Procedure History   (As Of: 10/06/2020 01:57:13 EDT)     P[facility] Perch Fall Risk Assessment Tool   Hx of falling last 3 months ED Fall :   No   Patient confused or disoriented ED Fall :   No   Patient intoxicated or sedated ED Fall :   No   Patient impaired gait ED Fall :   No   Use a mobility assistance device ED Fall :   No   Patient altered elimination ED Fall :   No    [facility] ED Fall Score :   0    Michela Pitcher C-RN - 10/06/2020 1:56 EDT   ED Advance Directive   Advance Directive :   No   Michela Pitcher C-RN - 10/06/2020 1:56 EDT   Med Hx   Medication List   (As Of: 10/06/2020 01:57:13 EDT)

## 2020-10-06 NOTE — Discharge Summary (Signed)
ED Clinical Summary                         [facility]  [address]  [place], [place], [address]  [phone]           PERSON INFORMATION  Name: Christopher Downs, Christopher Downs Age:  40 Years DOB: 1980-05-08   Sex: Male Language: English PCP: PCP,  NONE   Marital Status:  Married Phone: [phone] Med Service: MED-Medicine   MRN:  [id number] Acct# [id number] Arrival: 10/06/2020 01:17:00   Visit Reason: SOB Acuity: 3 LOS: 000 00:43   Address:      [address] [place] [address]  Diagnosis:      Heat exposure  Printed Prescriptions:            Allergies      No Known Medication Allergies      Medications Administered During Visit:        Patient Medication List:              No Medications Documented         Major Tests and Procedures:  The following procedures and tests were performed during your ED visit.  COMMONPROCEDURES%>  COMMON PROCEDURESCOMMENTS%>          Laboratory Orders  No laboratory orders were placed.              Radiology Orders  No radiology orders were placed.              Patient Care Orders  Name Status Details   Discharge Patient Ordered 10/06/20 1:18:00 EDT   ED Assessment Adult Completed 10/06/20 1:23:12 EDT, 10/06/20 1:23:12 EDT   ED Secondary Triage Completed 10/06/20 1:23:12 EDT, 10/06/20 1:23:12 EDT   ED Triage Adult Completed 10/06/20 1:18:02 EDT, 10/06/20 1:18:02 EDT             PROVIDER INFORMATION               Provider Role Assigned Collene Mares [facility] ED Provider 10/06/2020 01:18:07    Michela Pitcher C-RN ED Nurse 10/06/2020 01:29:06        Attending Physician:  Duwaine Maxin THOMAS-MD     Admit Doc  RIVERS,  Fara Boros     Consulting Doc       VITALS INFORMATION  Vital Sign Triage Latest   Temp Oral ORAL_1%>36.7 degC ORAL%>36.7 degC   Temp Temporal TEMPORAL_1%> TEMPORAL%>   Temp Intravascular INTRAVASCULAR_1%> INTRAVASCULAR%>   Temp Axillary AXILLARY_1%> AXILLARY%>   Temp Rectal RECTAL_1%> RECTAL%>   02 Sat 97 % 97 %    Respiratory Rate RATE_1%>18 br/min RATE%>15 br/min   Peripheral Pulse Rate PULSE RATE_1%>81 bpm PULSE RATE%>81 bpm   Apical Heart Rate HEART RATE_1%> HEART RATE%>   Blood Pressure BLOOD PRESSURE_1%>/ BLOOD PRESSURE_1%>115 mmHg BLOOD PRESSURE%>137 mmHg / BLOOD PRESSURE%>100 mmHg                 Immunizations      No Immunizations Documented This Visit          DISCHARGE INFORMATION   Discharge Disposition: H Outpt-Sent Home   Discharge Location:    Home   Discharge Date and Time:    10/06/2020 02:00:00   ED Checkout Date and Time:    10/06/2020 02:00:00     DEPART REASON INCOMPLETE INFORMATION  Depart Action Incomplete Reason   Interactive View/I&O Recently assessed               Problems      No Problems Documented              Smoking Status      No Smoking Status Documented         PATIENT EDUCATION INFORMATION  Instructions:       Heat Exhaustion Information ()     Follow up:                    With: Address: When:   [facility]  Within 1 week   Comments:   Call [phone] for appt/location Mon-Fri 9a-4p. All other times call and leave a message with the answering service. The staff will contact you to set up your appointment.           ED PROVIDER DOCUMENTATION     Patient:   Christopher Downs, Christopher Downs             MRN: [id number]            FIN: [id number]               Age:   40 years     Sex:  Male     DOB:  10-30-1980   Associated Diagnoses:   Heat exposure   Author:   Moreen Fowler      Basic Information   Time seen: Provider Seen (ST)   ED Provider/Time:    Duwaine Maxin THOMAS-MD / 10/06/2020 01:18  .   Additional information: Chief Complaint from Nursing Triage Note   Chief Complaint   No qualifying data available.Marland Kitchen      History of Present Illness   The patient presents with 40 year old male presents here for possible heat exposure.  Patient was in the back of a police car when he began to feel overheated and little anxious.  He states he was not able to get any water.  EMS  was called.  Upon transport with EMS he was able to drink some water.  He states he is feeling totally back to his baseline at this time.  Offers no current complaints..        Review of Systems             Additional review of systems information: All systems reviewed as documented in chart.      Health Status   Allergies:    No active allergies have been recorded..      Past Medical/ Family/ Social History   Surgical history: Reviewed as documented in chart.   Family history: Reviewed as documented in chart.   Social history: Reviewed as documented in chart.   Problem list:    No qualifying data available  .      Physical Examination   General:  Alert, no acute distress.    Skin:  Warm, dry, intact.    Head:  Normocephalic, atraumatic.    Eye:  Extraocular movements are intact, normal conjunctiva.    Cardiovascular:  Regular rate and rhythm, Normal peripheral perfusion.    Respiratory:  Lungs are clear to auscultation, respirations are non-labored.       Medical Decision Making   Rationale:  Patient appears well.  Vital signs nice and stable.  He will be discharged..      Impression and Plan   Diagnosis   Heat exposure (ICD10-CM  T67.9XXA, Discharge, Medical)   Plan   Condition: Stable.    Disposition: Discharged: Time  10/06/2020 01:20:00, to home.    Patient was given the following educational materials: Heat Exhaustion Information ().    Follow up with: [facility] Within 1 week Call [phone] for appt/location Mon-Fri 9a-4p. All other times call and leave a message with the answering service. The staff will contact you to set up your appointment..    Counseled: Patient, Regarding diagnosis, Regarding diagnostic results, Regarding treatment plan, Patient indicated understanding of instructions.

## 2020-10-06 NOTE — ED Provider Notes (Signed)
General Medical Problem *ED        Patient:   Christopher Downs, Christopher Downs             MRN: [id number]            FIN: [id number]               Age:   40 years     Sex:  Male     DOB:  July 20, 1980   Associated Diagnoses:   Heat exposure   Author:   Moreen Fowler      Basic Information   Time seen: Provider Seen (ST)   ED Provider/Time:    Duwaine Maxin THOMAS-MD / 10/06/2020 01:18  .   Additional information: Chief Complaint from Nursing Triage Note   Chief Complaint   No qualifying data available.Marland Kitchen      History of Present Illness   The patient presents with 40 year old male presents here for possible heat exposure.  Patient was in the back of a police car when he began to feel overheated and little anxious.  He states he was not able to get any water.  EMS was called.  Upon transport with EMS he was able to drink some water.  He states he is feeling totally back to his baseline at this time.  Offers no current complaints..        Review of Systems             Additional review of systems information: All systems reviewed as documented in chart.      Health Status   Allergies:    No active allergies have been recorded..      Past Medical/ Family/ Social History   Surgical history: Reviewed as documented in chart.   Family history: Reviewed as documented in chart.   Social history: Reviewed as documented in chart.   Problem list:    No qualifying data available  .      Physical Examination   General:  Alert, no acute distress.    Skin:  Warm, dry, intact.    Head:  Normocephalic, atraumatic.    Eye:  Extraocular movements are intact, normal conjunctiva.    Cardiovascular:  Regular rate and rhythm, Normal peripheral perfusion.    Respiratory:  Lungs are clear to auscultation, respirations are non-labored.       Medical Decision Making   Rationale:  Patient appears well.  Vital signs nice and stable.  He will be discharged..      Impression and Plan   Diagnosis   Heat exposure (ICD10-CM T67.9XXA, Discharge, Medical)    Plan   Condition: Stable.    Disposition: Discharged: Time  10/06/2020 01:20:00, to home.    Patient was given the following educational materials: Heat Exhaustion Information ().    Follow up with: [facility] Within 1 week Call [phone] for appt/location Mon-Fri 9a-4p. All other times call and leave a message with the answering service. The staff will contact you to set up your appointment..    Counseled: Patient, Regarding diagnosis, Regarding diagnostic results, Regarding treatment plan, Patient indicated understanding of instructions.      Signature Line     Electronically Signed on 10/06/2020 01:20 AM EDT   ________________________________________________   Duwaine Maxin THOMAS-MD

## 2020-10-06 NOTE — ED Notes (Signed)
 ED Patient Education Note     Patient Education Materials Follows:          Heat Exhaustion Information  WHAT IS HEAT EXHAUSTION?  Heat exhaustion happens when your body gets overheated from hot weather or from exercise. Heat exhaustion makes the temperature of your skin and body go up.  Your body cools itself by sweating. If you do not drink enough water to replace what you sweat, you lose too much water and salt. This makes it harder for your body to produce more sweat. When you do not sweat enough, your body cannot cool down, and heat exhaustion may result.  Heat exhaustion can lead to heatstroke, which is a more serious illness.  WHO IS AT RISK FOR HEAT EXHAUSTION?  Anyone can get heat exhaustion. However, heat exhaustion is more likely when you are exercising or doing a physical activity. It is also more likely when you are in hot and humid weather or bright sunshine.  Heat exhaustion is also more likely to develop in:  ??? People who are age 38 or older.  ??? Children.  ??? People who have a medical condition such as heart disease, poor circulation, sickle cell disease, or high blood pressure.  ??? People who have a fever.  ??? People who are very overweight (obese).  ??? People who are dehydrated from:  ? Drinking alcohol or caffeine.  ? Taking certain medicines, such as diuretics or stimulants.  WHAT ARE THE SYMPTOMS OF HEAT EXHAUSTION?  Symptoms of heat exhaustion include:  ??? A body temperature of up to 104??F (40??C) but can also be normal.  ??? Moist, cool, and clammy skin.  ??? Dizziness.  ??? Headache.  ??? Nausea/vomiting.  ??? Fatigue.  ??? Thirst.  ??? Dark-colored urine.  ??? Rapid pulse or heartbeat.  ??? Weakness.  ??? Muscle cramps.  ??? Confusion.  ??? Fainting.  WHAT SHOULD I DO IF I THINK I HAVE HEAT EXHAUSTION?  If you think that you have heat exhaustion, call your health care provider. Follow his or her instructions. You should also:  ??? Call a friend or a family member and ask someone to stay with you.  ??? Move to a cooler  location, such as:  ? Into the shade.  ? In front of a fan.  ? Someplace that has air conditioning.  ??? Lie down and rest.  ??? Slowly drink nonalcoholic, caffeine-free fluids.  ??? Take off any extra clothing or tight-fitting clothes.  ??? Take a cool bath or shower, if possible. If you do not have access to a bath or shower, dab or mist cool water on your skin.  WHY IS IT IMPORTANT TO TREAT HEAT EXHAUSTION?  It is extremely important to take care of yourself and treat heat exhaustion as soon as possible. Untreated heat exhaustion can turn into heatstroke. Symptoms of heatstroke include:  ??? A body temperature of 104??F (40??C) or higher.  ??? Hot, red skin that may be dry or moist.  ??? Severe headache.  ??? Nausea and vomiting.  ??? Muscle weakness and cramping.  ??? Confusion.  ??? Rapid breathing.  ??? Fainting.  ??? Seizure.  These symptoms may represent a serious problem that is an emergency. Do not wait to see if the symptoms will go away. Get medical help right away. Call your local emergency services (911 in the [place] Do not drive yourself to the hospital.   Heatstroke is a life-threatening condition that requires urgent medical treatment. Do not treat heatstroke  at home. Heatstroke should be treated by a health care professional and may require hospitalization. At the hospital, you may need to receive fluids through an IV tube:  ??? If you cannot drink any fluids.  ??? If you vomit any fluids that you drink.  ??? If your symptoms do not get better after one hour.  ??? If your symptoms get worse after one hour.  HOW CAN I PREVENT HEAT EXHAUSTION?  ??? Avoid outdoor activities on very hot or humid days.  ??? Do not exercise or do other physical activity when you are not feeling well.  ??? Drink plenty of nonalcoholic and caffeine-free fluids before and during physical activity.  ??? Take frequent breaks for rest during physical activity.  ??? Wear light-colored, loose-fitting, and lightweight clothing in the heat.  ??? Wear a hat and use sunscreen  when exercising outdoors.  ??? Avoid being outside during the hottest times of the day.  ??? Check with your health care provider before you start any new activity, especially if you take medicine or have a medical condition.  ??? Start any new activity slowly and work up to your fitness level.  HOW CAN I HELP TO PROTECT ELDERLY RELATIVES AND NEIGHBORS FROM HEAT EXHAUSTION?  People who are age 38 or older are at greater risk for heat exhaustion. Their bodies have a harder time adjusting to heat. They are also more likely to have a medical condition or be on medicines that increase their risk for heat exhaustion. They may get heat exhaustion indoors if the heat is high for several days. You can help to protect them during hot weather by:  ??? Checking on them two or more times each day.  ??? Making sure that they are drinking plenty of cool, nonalcoholic, and caffeine-free fluids.  ??? Making sure that they use their air conditioner.  ??? Taking them to a location where air conditioning is available.  ??? Talking with their health care provider about their medical needs, medicines, and fluid requirements.  This information is not intended to replace advice given to you by your health care provider. Make sure you discuss any questions you have with your health care provider.  Document Released: 10/12/2007 Document Revised: 09/23/2014 Document Reviewed: 12/10/2013  Elsevier Interactive Patient Education ??2016 [employer].

## 2020-10-06 NOTE — ED Notes (Signed)
 ED Patient Summary                 [facility]  [address] [place], [place] [address]  [phone]  Discharge Instructions (Patient)  Christopher Downs, Christopher Downs  DOB:  [birth year]                   MRN: [id number]                   FIN: [id number]  Reason For Visit: SOB  Final Diagnosis: Heat exposure     Visit Date: 10/06/2020 01:17:00  Address: [address] [place] [address]  Phone: [phone]     Emergency Department Providers:         Primary Physician:      Duwaine Maxin [facility] would like to thank you for allowing [place] to assist you with your healthcare needs. The following includes patient education materials and information regarding your injury/illness.     Follow-up Instructions:  You were seen today on an emergency basis. Please contact your primary care doctor for a follow up appointment. If you received a referral to a specialist doctor, it is important you follow-up as instructed.    It is important that you call your follow-up doctor to schedule and confirm the location of your next appointment. Your doctor may practice at multiple locations. The office location of your follow-up appointment may be different to the one written on your discharge instructions.    If you do not have a primary care doctor, please call [phone] for help in finding a [facility] Provider. For help in finding a specialist doctor, please call [phone].    If your condition gets worse before your follow-up with your primary care doctor or specialist, please return to the Emergency Department.      Coronavirus 2019 (COVID-19) Reminders:     Patients age 60 - 60, with parental consent, and over age 74 can make an appointment for a COVID-19 vaccine. Patients can contact their [facility] doctors' offices to schedule an appointment to receive the COVID-19 vaccine. Patients who do not have a [facility]  physician can call [phone] to schedule vaccination appointments.      Follow Up Appointments:  Primary Care Provider:     Name: PCP,  NONE     Phone:                  With: Address: When:   [facility]  Within 1 week   Comments:   Call [phone] for appt/location Mon-Fri 9a-4p. All other times call and leave a message with the answering service. The staff will contact you to set up your appointment.              [facility] SERVICES%>       Allergy Info: No Known Medication Allergies     Discharge Additional Information          Discharge Patient 10/06/20 1:18:00 EDT      Patient Education Materials:             Heat Exhaustion Information  WHAT IS HEAT EXHAUSTION?  Heat exhaustion happens when your body gets overheated from hot weather or from exercise. Heat exhaustion makes the temperature of your skin and body go up.  Your body cools itself by sweating. If you do not  drink enough water to replace what you sweat, you lose too much water and salt. This makes it harder for your body to produce more sweat. When you do not sweat enough, your body cannot cool down, and heat exhaustion may result.  Heat exhaustion can lead to heatstroke, which is a more serious illness.  WHO IS AT RISK FOR HEAT EXHAUSTION?  Anyone can get heat exhaustion. However, heat exhaustion is more likely when you are exercising or doing a physical activity. It is also more likely when you are in hot and humid weather or bright sunshine.  Heat exhaustion is also more likely to develop in:  ??? People who are age 27 or older.  ??? Children.  ??? People who have a medical condition such as heart disease, poor circulation, sickle cell disease, or high blood pressure.  ??? People who have a fever.  ??? People who are very overweight (obese).  ??? People who are dehydrated from:  ? Drinking alcohol or caffeine.  ? Taking certain medicines, such as diuretics or stimulants.  WHAT ARE THE SYMPTOMS OF HEAT  EXHAUSTION?  Symptoms of heat exhaustion include:  ??? A body temperature of up to 104??F (40??C) but can also be normal.  ??? Moist, cool, and clammy skin.  ??? Dizziness.  ??? Headache.  ??? Nausea/vomiting.  ??? Fatigue.  ??? Thirst.  ??? Dark-colored urine.  ??? Rapid pulse or heartbeat.  ??? Weakness.  ??? Muscle cramps.  ??? Confusion.  ??? Fainting.  WHAT SHOULD I DO IF I THINK I HAVE HEAT EXHAUSTION?  If you think that you have heat exhaustion, call your health care provider. Follow his or her instructions. You should also:  ??? Call a friend or a family member and ask someone to stay with you.  ??? Move to a cooler location, such as:  ? Into the shade.  ? In front of a fan.  ? Someplace that has air conditioning.  ??? Lie down and rest.  ??? Slowly drink nonalcoholic, caffeine-free fluids.  ??? Take off any extra clothing or tight-fitting clothes.  ??? Take a cool bath or shower, if possible. If you do not have access to a bath or shower, dab or mist cool water on your skin.  WHY IS IT IMPORTANT TO TREAT HEAT EXHAUSTION?  It is extremely important to take care of yourself and treat heat exhaustion as soon as possible. Untreated heat exhaustion can turn into heatstroke. Symptoms of heatstroke include:  ??? A body temperature of 104??F (40??C) or higher.  ??? Hot, red skin that may be dry or moist.  ??? Severe headache.  ??? Nausea and vomiting.  ??? Muscle weakness and cramping.  ??? Confusion.  ??? Rapid breathing.  ??? Fainting.  ??? Seizure.  These symptoms may represent a serious problem that is an emergency. Do not wait to see if the symptoms will go away. Get medical help right away. Call your local emergency services (911 in the [place] Do not drive yourself to the hospital.   Heatstroke is a life-threatening condition that requires urgent medical treatment. Do not treat heatstroke at home. Heatstroke should be treated by a health care professional and may require hospitalization. At the hospital, you may need to receive fluids through an IV tube:  ??? If you  cannot drink any fluids.  ??? If you vomit any fluids that you drink.  ??? If your symptoms do not get better after one hour.  ??? If your symptoms get worse after  one hour.  HOW CAN I PREVENT HEAT EXHAUSTION?  ??? Avoid outdoor activities on very hot or humid days.  ??? Do not exercise or do other physical activity when you are not feeling well.  ??? Drink plenty of nonalcoholic and caffeine-free fluids before and during physical activity.  ??? Take frequent breaks for rest during physical activity.  ??? Wear light-colored, loose-fitting, and lightweight clothing in the heat.  ??? Wear a hat and use sunscreen when exercising outdoors.  ??? Avoid being outside during the hottest times of the day.  ??? Check with your health care provider before you start any new activity, especially if you take medicine or have a medical condition.  ??? Start any new activity slowly and work up to your fitness level.  HOW CAN I HELP TO PROTECT ELDERLY RELATIVES AND NEIGHBORS FROM HEAT EXHAUSTION?  People who are age 29 or older are at greater risk for heat exhaustion. Their bodies have a harder time adjusting to heat. They are also more likely to have a medical condition or be on medicines that increase their risk for heat exhaustion. They may get heat exhaustion indoors if the heat is high for several days. You can help to protect them during hot weather by:  ??? Checking on them two or more times each day.  ??? Making sure that they are drinking plenty of cool, nonalcoholic, and caffeine-free fluids.  ??? Making sure that they use their air conditioner.  ??? Taking them to a location where air conditioning is available.  ??? Talking with their health care provider about their medical needs, medicines, and fluid requirements.  This information is not intended to replace advice given to you by your health care provider. Make sure you discuss any questions you have with your health care provider.  Document Released: 10/12/2007 Document Revised: 09/23/2014 Document  Reviewed: 12/10/2013  Elsevier Interactive Patient Education ??2016 Elsevier Inc.      ---------------------------------------------------------------------------------------------------------------------  [facility] allows patients to review your COVID and other test results as well as discharge documents from any [facility], Emergency Department, surgical center or outpatient lab. Test results are typically available 36 hours after the test is completed.     [facility] encourages you to self-enroll in the [facility] Patient Portal.     To begin your self-enrollment process, please visit [email or web address] Under [facility], click on ???Sign up now???.     NOTE: You must be 16 years and older to use [facility] Self-Enroll online. If you are a parent, caregiver, or guardian; you need an invite to access your child???s or dependent???s health records. To obtain an invite, contact the Medical Records department at [phone] Monday through Friday, 8-4:30, select option 3 . If we receive your call afterhours, we will return your call the next business day.     If you have issues trying to create or access your account, contact [employer] support at [phone] available 7 days a week 24 hours a day.     Comment:

## 2020-10-06 NOTE — Telephone Encounter (Signed)
Received approval for [facility] from 09-23-20 to 03-22-21. Tried to reach patient, mailbox is full//[email or web address]

## 2020-10-06 NOTE — ED Notes (Signed)
 ED Triage Note       ED Triage Adult Entered On:  10/06/2020 1:23 EDT    Performed On:  10/06/2020 1:20 EDT by Hazel Sams               Triage   Numeric Rating Pain Scale :   0 = No pain   Chief Complaint :   PT with c/o feeling SOB and "hot" less than 1 hour ago. Pt was detained in the back of a police car.  Pt states he is feeling back to normal now; denies CP/pressure or SOB.    [place] Mode of Arrival :   Ambulance   Infectious Disease Documentation :   Document assessment   Temperature Oral :   36.7 degC(Converted to: 98.1 degF)    Heart Rate Monitored :   95 bpm   Respiratory Rate :   18 br/min   Systolic Blood Pressure :   168 mmHg (HI)    Diastolic Blood Pressure :   115 mmHg (>HHI)    SpO2 :   97 %   Oxygen Therapy :   Room air   Patient presentation :   None of the above   Chief Complaint or Presentation suggest infection :   No   Weight Dosing :   118 kg(Converted to: 260 lb 2 oz)    Height :   187 cm(Converted to: 6 ft 2 in)    Body Mass Index Dosing :   34 kg/m2   Alvarado,  Crystal-RN - 10/06/2020 1:20 EDT   DCP GENERIC CODE   Tracking Acuity :   3   Tracking Group :   ED [employer]   Hazel Sams - 10/06/2020 1:20 EDT   ED General Section :   Document assessment   Pregnancy Status :   N/A   ED Allergies Section :   Document assessment   ED Reason for Visit Section :   Document assessment   ED Quick Assessment :   Patient appears awake, alert, oriented to baseline. Skin warm and dry. Moves all extremities. Respiration even and unlabored. Appears in no apparent distress.   Hazel Sams - 10/06/2020 1:20 EDT   PTA/Triage Treatments   ED PTA Pre-Arrival Service :   [facility]   ED PTA Medic Number :   1    Hazel Sams - 10/06/2020 1:20 EDT   ID Risk Screen Symptoms   Recent Travel History :   No recent travel   TB Symptom Screen :   No symptoms   Last 90 days COVID-19 ID :   No   Close Contact with COVID-19 ID :   No   Last 14 days COVID-19 ID :    No   C. diff Symptom/History ID :   Neither of the above   Alvarado,  Crystal-RN - 10/06/2020 1:20 EDT   Allergies   (As Of: 10/06/2020 01:23:11 EDT)   Allergies (Active)   No Known Medication Allergies  Estimated Onset Date:   Unspecified ; Created ByHazel Sams; Reaction Status:   Active ; Category:   Drug ; Substance:   No Known Medication Allergies ; Type:   Allergy ; Updated By:   Hazel Sams; Reviewed Date:   10/06/2020 1:20 EDT        Psycho-Social   Last 3 mo, thoughts killing self/others :   Patient denies   Right click within  box for Suspected Abuse policy link. :   None   Feels Safe Where Live :   Yes   ED Behavioral Activity Rating Scale :   4 - Quiet and awake (normal level of activity)   Alvarado,  Crystal-RN - 10/06/2020 1:20 EDT   ED Reason for Visit   (As Of: 10/06/2020 01:23:11 EDT)   Problems(Active)    MI (myocardial infarction) (SNOMED CT  :54098119 )  Name of Problem:   MI (myocardial infarction) ; Recorder:   Hazel Sams; Confirmation:   Confirmed ; Classification:   Patient Stated ; Code:   [id number] ; Contributor System:   PowerChart ; Last Updated:   10/06/2020 1:21 EDT ; Life Cycle Date:   10/06/2020 ; Life Cycle Status:   Active ; Vocabulary:   SNOMED CT        Stented coronary artery (SNOMED CT  :2130865784 )  Name of Problem:   Stented coronary artery ; Recorder:   Hazel Sams; Confirmation:   Confirmed ; Classification:   Patient Stated ; Code:   [id number] ; Contributor System:   PowerChart ; Last Updated:   10/06/2020 1:21 EDT ; Life Cycle Date:   10/06/2020 ; Life Cycle Status:   Active ; Vocabulary:   SNOMED CT          Diagnoses(Active)    Heat exposure  Date:   10/06/2020 ; Diagnosis Type:   Discharge ; Confirmation:   Confirmed ; Clinical Dx:   Heat exposure ; Classification:   Medical ; Clinical Service:   Non-Specified ; Code:   ICD-10-CM ; Probability:   0 ; Diagnosis Code:   T67.9XXA

## 2020-12-07 MED ORDER — TICAGRELOR 90 MG PO TABS
90 MG | ORAL_TABLET | Freq: Two times a day (BID) | ORAL | 3 refills | Status: AC
Start: 2020-12-07 — End: 2021-04-15

## 2020-12-07 NOTE — Telephone Encounter (Signed)
Received fax from [facility] requesting prescription for Brilinta. Prescription printed, signed and faxed//brendab

## 2021-02-18 MED ORDER — CARVEDILOL 3.125 MG PO TABS
3.125 MG | ORAL_TABLET | Freq: Two times a day (BID) | ORAL | 5 refills | Status: AC
Start: 2021-02-18 — End: 2021-08-25

## 2021-02-18 MED ORDER — CLOPIDOGREL BISULFATE 75 MG PO TABS
75 MG | ORAL_TABLET | Freq: Every day | ORAL | 1 refills | Status: DC
Start: 2021-02-18 — End: 2021-08-25

## 2021-02-18 MED ORDER — ATORVASTATIN CALCIUM 40 MG PO TABS
40 MG | ORAL_TABLET | Freq: Every evening | ORAL | 5 refills | Status: DC
Start: 2021-02-18 — End: 2021-08-25

## 2021-02-18 MED ORDER — VALSARTAN 80 MG PO TABS
80 MG | ORAL_TABLET | Freq: Every day | ORAL | 5 refills | Status: DC
Start: 2021-02-18 — End: 2021-08-25

## 2021-02-18 NOTE — Telephone Encounter (Signed)
MEDICATION REFILL REQUEST      Name of Medication:  Plavix  Dose:  75MG   Frequency:  1POQD  Quantity:  ?  Days' supply:  ?    ---  MEDICATION REFILL REQUEST      Name of Medication:  Diovan  Dose:  80MG   Frequency:  1POQD  Quantity:  ?  Days' supply:  ?      ---  MEDICATION REFILL REQUEST      Name of Medication:  Coreg  Dose:  3.125MG   Frequency:  1POBID with meals  Quantity:  ?  Days' supply:  ?      ---  MEDICATION REFILL REQUEST      Name of Medication:  Lipitor  Dose:  40MG   Frequency:  1POQDQHS  Quantity:  ?  Days' supply:  ?      Pharmacy Name/Location:  [facility]

## 2021-02-18 NOTE — Telephone Encounter (Signed)
Prescriptions sent to pharmacy//brendab  Requested Prescriptions     Signed Prescriptions Disp Refills    clopidogrel (PLAVIX) 75 MG tablet 90 tablet 1     Sig: Take 1 tablet by mouth daily     Authorizing Provider: Joelene Millin     Ordering User: Binnie Kand, Jeremyah Jelley    valsartan (DIOVAN) 80 MG tablet 30 tablet 5     Sig: Take 1 tablet by mouth daily     Authorizing Provider: Joelene Millin     Ordering User: Binnie Kand, Rushawn Capshaw    carvedilol (COREG) 3.125 MG tablet 60 tablet 5     Sig: Take 1 tablet by mouth 2 times daily (with meals)     Authorizing Provider: Joelene Millin     Ordering User: Binnie Kand, Shoua Ressler    atorvastatin (LIPITOR) 40 MG tablet 30 tablet 5     Sig: Take 1 tablet by mouth nightly     Authorizing Provider: Joelene Millin     Ordering User: Alain Honey

## 2021-03-18 NOTE — Telephone Encounter (Signed)
Received notification that [facility] has shipped Brilinta to patient. Should received in 1-2 business days//brendab

## 2021-03-31 ENCOUNTER — Encounter: Attending: Cardiovascular Disease | Primary: Sports Medicine

## 2021-03-31 NOTE — Progress Notes (Unsigned)
[facility]  [address], [address]  [place], [place] [address]  PHONE: [phone]    Cicel Pasierb  [birth year]      SUBJECTIVE:   Arsene Deshazier is a 41 y.o. male seen for a follow up visit regarding the following:     No chief complaint on file.      HPI:    Jahir Kossman presents for routine follow up for known Coronary Artery Disease. Multiple issues addressed as outlined below:     Coronary Artery Disease:  Patient denies any recent angina.  Notes compliance with medical therapy.  No recent NTG use.  No intolerance to anti-platelet therapy. No blood in stool or melena.     Hypertension:  Ambulatory BP readings have been ***.  Patient reports compliance with medical therapy without side effects.      Hyperlipidemia:   Cholesterol panel  {Blank Single Select Template:40611::"has been recently checked and is at goal","is outlined below and patient is tolerating current statin therapy.","followed by PCP and patient reports lipids under acceptable control.  Tolerating current statin dose without side effects. ","has not been recently checked.","***."}.    Exercise:  {Blank Single Select Template:40611::"Current regimen consists of ***.","Not currently exercising on routine basis but leads an active lifestyle.","Not currently exercising and notes an inactive lifestyle.","Not currently exercising due to poor functional capacity and co-morbidities."}        Past Medical History, Past Surgical History, Family history, Social History, and Medications were all reviewed with the patient today and updated as necessary.           Current Outpatient Medications:     clopidogrel (PLAVIX) 75 MG tablet, Take 1 tablet by mouth daily, Disp: 90 tablet, Rfl: 1    valsartan (DIOVAN) 80 MG tablet, Take 1 tablet by mouth daily, Disp: 30 tablet, Rfl: 5    carvedilol (COREG) 3.125 MG tablet, Take 1 tablet by mouth 2 times daily (with meals), Disp: 60 tablet, Rfl: 5    atorvastatin (LIPITOR) 40 MG tablet, Take 1  tablet by mouth nightly, Disp: 30 tablet, Rfl: 5    ticagrelor (BRILINTA) 90 MG TABS tablet, Take 1 tablet by mouth 2 times daily, Disp: 180 tablet, Rfl: 3    nitroGLYCERIN (NITROSTAT) 0.4 MG SL tablet, up to max of 3 total doses. If no relief after 1 dose, call 911., Disp: 25 tablet, Rfl: 3    aspirin 81 MG chewable tablet, Take 81 mg by mouth daily, Disp: , Rfl:      No Known Allergies     Patient Active Problem List    Diagnosis Date Noted    Coronary artery disease involving native coronary artery of native heart 07/14/2020     Priority: High     1.  NSTEMI 12/01/19:  4 x 15 mm DES.   2.  NSTEMI 07/14/20:  PCI of RCA using a 4.5 x 26 mm resolute Onyx drug-eluting stent.  Prior stent was underexpanded.  Postdilated to 5.0 mm.  Post interventional IVUS.  All disease in other segments.  EF 45-50% inferior akinesis  3.  Echo (07/14/20):  EF 50-55%.  Inferior HK.  Normal diastolic function.  Mild MR.       Hypertension 07/14/2020     Priority: High    Hyperglycemia 07/14/2020     Priority: High    Mixed hyperlipidemia 07/29/2020     Priority: Medium        Social History     Tobacco Use  Smoking status: Never    Smokeless tobacco: Never   Substance Use Topics    Alcohol use: Not on file       ROS:    Review of Systems   Constitutional: Negative for malaise/fatigue.   Cardiovascular:  Negative for chest pain.   Respiratory:  Negative for shortness of breath.    Musculoskeletal:  Positive for arthritis.   Neurological:  Negative for focal weakness.   Psychiatric/Behavioral:  Negative for depression.          PHYSICAL EXAM:  Wt Readings from Last 3 Encounters:   09/16/20 247 lb 9.6 oz (112.3 kg)   07/29/20 241 lb (109.3 kg)   07/15/20 237 lb (107.5 kg)     BP Readings from Last 3 Encounters:   09/16/20 118/88   07/29/20 118/80   07/15/20 (!) 148/86     Pulse Readings from Last 3 Encounters:   09/16/20 76   07/29/20 72   07/15/20 67       Physical Exam  Constitutional:       General: He is not in acute distress.  Neck:       Vascular: No carotid bruit.   Cardiovascular:      Rate and Rhythm: Normal rate and regular rhythm.   Pulmonary:      Effort: No respiratory distress.      Breath sounds: Normal breath sounds.   Abdominal:      General: Bowel sounds are normal.      Palpations: Abdomen is soft.   Musculoskeletal:         General: No swelling.   Skin:     General: Skin is warm and dry.   Neurological:      General: No focal deficit present.   Psychiatric:         Mood and Affect: Mood normal.       Medical problems and test results were reviewed with the patient today.       Lab Results   Component Value Date    WBC 7.4 07/15/2020    HGB 14.8 07/15/2020    HCT 42.3 07/15/2020    MCV 86.3 07/15/2020    PLT 272 07/15/2020       Lab Results   Component Value Date/Time    NA 140 07/15/2020 03:09 AM    K 3.9 07/15/2020 03:09 AM    CL 108 07/15/2020 03:09 AM    CO2 25 07/15/2020 03:09 AM    BUN 13 07/15/2020 03:09 AM    CREATININE 1.20 07/15/2020 03:09 AM    GLUCOSE 131 07/15/2020 03:09 AM    CALCIUM 8.6 07/15/2020 03:09 AM        Lab Results   Component Value Date    CHOL 227 (H) 07/15/2020     Lab Results   Component Value Date    TRIG 566 (H) 07/15/2020     Lab Results   Component Value Date    HDL 36 (L) 07/15/2020     Lab Results   Component Value Date    [facility] Not calculated due to elevated triglyceride level 07/15/2020     Lab Results   Component Value Date    LABVLDL 113.2 (H) 07/15/2020     Lab Results   Component Value Date    CHOLHDLRATIO 6.3 07/15/2020        Data from outside records/labs from outside providers have been reviewed and summarized as noted in the HPI, past history and data review sections  of this note       ASSESSMENT and PLAN      There are no diagnoses linked to this encounter.           No follow-ups on file.       Ebony Hail, MD  03/30/2021  7:28 PM    This note may have been dictated using speech recognition software.  As a result, error of speech recognition may have occurred

## 2021-04-15 ENCOUNTER — Ambulatory Visit: Admit: 2021-04-15 | Discharge: 2021-04-15 | Attending: Cardiovascular Disease | Primary: Sports Medicine

## 2021-04-15 ENCOUNTER — Encounter

## 2021-04-15 DIAGNOSIS — I2510 Atherosclerotic heart disease of native coronary artery without angina pectoris: Secondary | ICD-10-CM

## 2021-04-15 NOTE — Progress Notes (Signed)
[facility]  [address], [address]  [place], [place] [address]  PHONE: [phone]    Christopher Downs  1980-06-23      SUBJECTIVE:   Christopher Downs is a 41 y.o. male seen for a follow up visit regarding the following:     Chief Complaint   Patient presents with    Coronary Artery Disease    Hypertension       HPI:    Christopher Downs presents for routine follow up for known Coronary Artery Disease. Multiple issues addressed as outlined below:     Coronary Artery Disease:  Patient denies any recent angina.  Notes compliance with medical therapy.  No recent NTG use.  No intolerance to anti-platelet therapy.     Hypertension:  Ambulatory BP readings have been controlled.  Patient reports compliance with medical therapy without side effects.      Hyperlipidemia:   Cholesterol panel  has not been recently checked..    Exercise:  Staying active with exercise and wor.       Past Medical History, Past Surgical History, Family history, Social History, and Medications were all reviewed with the patient today and updated as necessary.           Current Outpatient Medications:     clopidogrel (PLAVIX) 75 MG tablet, Take 1 tablet by mouth daily, Disp: 90 tablet, Rfl: 1    valsartan (DIOVAN) 80 MG tablet, Take 1 tablet by mouth daily, Disp: 30 tablet, Rfl: 5    carvedilol (COREG) 3.125 MG tablet, Take 1 tablet by mouth 2 times daily (with meals), Disp: 60 tablet, Rfl: 5    atorvastatin (LIPITOR) 40 MG tablet, Take 1 tablet by mouth nightly, Disp: 30 tablet, Rfl: 5    nitroGLYCERIN (NITROSTAT) 0.4 MG SL tablet, up to max of 3 total doses. If no relief after 1 dose, call 911., Disp: 25 tablet, Rfl: 3    aspirin 81 MG chewable tablet, Take 81 mg by mouth daily, Disp: , Rfl:      No Known Allergies     Patient Active Problem List    Diagnosis Date Noted    Coronary artery disease involving native coronary artery of native heart 07/14/2020     Priority: High     INDEFINITE DAPT DUE TO STENT THROMBOSIS  1.  NSTEMI  12/01/19:  4 x 15 mm DES.   2.  NSTEMI 07/14/20:  PCI of RCA using a 4.5 x 26 mm resolute Onyx drug-eluting stent.  Prior stent was underexpanded.  Postdilated to 5.0 mm.  Post interventional IVUS.  All disease in other segments.  EF 45-50% inferior akinesis  3.  Echo (07/14/20):  EF 50-55%.  Inferior HK.  Normal diastolic function.  Mild MR.       Hypertension 07/14/2020     Priority: High    Hyperglycemia 07/14/2020     Priority: High    Mixed hyperlipidemia 07/29/2020     Priority: Medium        Social History     Tobacco Use    Smoking status: Never    Smokeless tobacco: Never   Substance Use Topics    Alcohol use: Not on file       ROS:    Review of Systems   Constitutional: Negative for malaise/fatigue.   Cardiovascular:  Negative for chest pain.   Respiratory:  Negative for shortness of breath.    Musculoskeletal:  Negative for arthritis.   Neurological:  Negative for focal weakness.  Psychiatric/Behavioral:  Negative for depression.          PHYSICAL EXAM:  Wt Readings from Last 3 Encounters:   04/15/21 254 lb (115.2 kg)   09/16/20 247 lb 9.6 oz (112.3 kg)   07/29/20 241 lb (109.3 kg)     BP Readings from Last 3 Encounters:   04/15/21 132/88   09/16/20 118/88   07/29/20 118/80     Pulse Readings from Last 3 Encounters:   04/15/21 68   09/16/20 76   07/29/20 72       Physical Exam  Constitutional:       General: He is not in acute distress.  Neck:      Vascular: No carotid bruit.   Cardiovascular:      Rate and Rhythm: Normal rate and regular rhythm.   Pulmonary:      Effort: No respiratory distress.      Breath sounds: Normal breath sounds.   Abdominal:      General: Bowel sounds are normal.      Palpations: Abdomen is soft.   Musculoskeletal:         General: No swelling.   Skin:     General: Skin is warm and dry.   Neurological:      General: No focal deficit present.   Psychiatric:         Mood and Affect: Mood normal.       Medical problems and test results were reviewed with the patient today.       Lab  Results   Component Value Date    WBC 7.4 07/15/2020    HGB 14.8 07/15/2020    HCT 42.3 07/15/2020    MCV 86.3 07/15/2020    PLT 272 07/15/2020       Lab Results   Component Value Date/Time    NA 140 07/15/2020 03:09 AM    K 3.9 07/15/2020 03:09 AM    CL 108 07/15/2020 03:09 AM    CO2 25 07/15/2020 03:09 AM    BUN 13 07/15/2020 03:09 AM    CREATININE 1.20 07/15/2020 03:09 AM    GLUCOSE 131 07/15/2020 03:09 AM    CALCIUM 8.6 07/15/2020 03:09 AM        Lab Results   Component Value Date    CHOL 227 (H) 07/15/2020     Lab Results   Component Value Date    TRIG 566 (H) 07/15/2020     Lab Results   Component Value Date    HDL 36 (L) 07/15/2020     Lab Results   Component Value Date    [facility] Not calculated due to elevated triglyceride level 07/15/2020     Lab Results   Component Value Date    LABVLDL 113.2 (H) 07/15/2020     Lab Results   Component Value Date    CHOLHDLRATIO 6.3 07/15/2020        Data from outside records/labs from outside providers have been reviewed and summarized as noted in the HPI, past history and data review sections of this note       ASSESSMENT and PLAN      1. Coronary artery disease involving native coronary artery of native heart without angina pectoris  Patient is doing well without any anginal symptoms.  Continue Aspirin 81 mg a day, Plavix 75 mg a day and PRN NTG.  We discussed the importance of continued risk factor modification.  The patient is encouraged to contact my office with any worsening  dyspnea or chest discomfort.    - CBC; Future  - Comprehensive Metabolic Panel; Future  - Lipid Panel; Future    2. Primary hypertension  Blood pressure currently is well controlled.  Continue carvedilol and Diovan.      3. Mixed hyperlipidemia  Continue Lipitor.    - CBC; Future  - Comprehensive Metabolic Panel; Future  - Lipid Panel; Future           Return in about 6 months (around 10/15/2021).       Ebony HailMatthew Gregory Lynnelle Mesmer, MD  04/15/2021  10:59 AM    This note may have been dictated using  speech recognition software.  As a result, error of speech recognition may have occurred

## 2021-04-18 LAB — COMPREHENSIVE METABOLIC PANEL
ALT: 108 U/L — ABNORMAL HIGH (ref 12–65)
AST: 54 U/L — ABNORMAL HIGH (ref 15–37)
Albumin/Globulin Ratio: 1.2 (ref 0.4–1.6)
Albumin: 4.1 g/dL (ref 3.5–5.0)
Alk Phosphatase: 68 U/L (ref 50–136)
Anion Gap: 3 mmol/L (ref 2–11)
BUN: 10 MG/DL (ref 6–23)
CO2: 27 mmol/L (ref 21–32)
Calcium: 9.4 MG/DL (ref 8.3–10.4)
Chloride: 108 mmol/L (ref 101–110)
Creatinine: 1 MG/DL (ref 0.8–1.5)
Est, Glom Filt Rate: >60 mL/min/{1.73_m2} (ref >60)
Globulin: 3.3 g/dL (ref 2.8–4.5)
Glucose: 142 mg/dL — ABNORMAL HIGH (ref 65–100)
Potassium: 4.1 mmol/L (ref 3.5–5.1)
Sodium: 138 mmol/L (ref 133–143)
Total Bilirubin: 0.4 MG/DL (ref 0.2–1.1)
Total Protein: 7.4 g/dL (ref 6.3–8.2)

## 2021-04-18 LAB — CBC
Hematocrit: 45.9 % (ref 41.1–50.3)
Hemoglobin: 15.2 g/dL (ref 13.6–17.2)
MCH: 30.2 PG (ref 26.1–32.9)
MCHC: 33.1 g/dL (ref 31.4–35.0)
MCV: 91.1 FL (ref 82–102)
MPV: 10.1 FL (ref 9.4–12.3)
Platelets: 311 10*3/uL (ref 150–450)
RBC: 5.04 M/uL (ref 4.23–5.6)
RDW: 13.7 % (ref 11.9–14.6)
WBC: 4 10*3/uL — ABNORMAL LOW (ref 4.3–11.1)
nRBC: 0 10*3/uL (ref 0.0–0.2)

## 2021-04-18 LAB — LIPID PANEL
Chol/HDL Ratio: 3.2
Cholesterol, Total: 161 MG/DL (ref <200)
HDL: 50 MG/DL (ref 40–60)
LDL Calculated: 81.6 MG/DL (ref <100)
Triglycerides: 147 MG/DL (ref 35–150)
VLDL Cholesterol Calculated: 29.4 MG/DL — ABNORMAL HIGH (ref 6.0–23.0)

## 2021-04-19 NOTE — Telephone Encounter (Addendum)
Patient called with results, voiced understanding and thanked me//brendab    ----- Message from Joelene Millin, MD sent at 04/19/2021  2:11 PM EDT -----  Please call patient.  Labs are reviewed and are acceptable.  Continue current medications.  Call with any questions, concerns or new/worsening cardiac symptoms.

## 2021-06-11 IMAGING — DX DG CHEST 1V PORT
1 series · 1 of 1 positions shown · non-contrast
Comparison: None.

CLINICAL DATA: 39-year-old male with chest pain.

EXAM:
PORTABLE CHEST 1 VIEW

[chest ap]
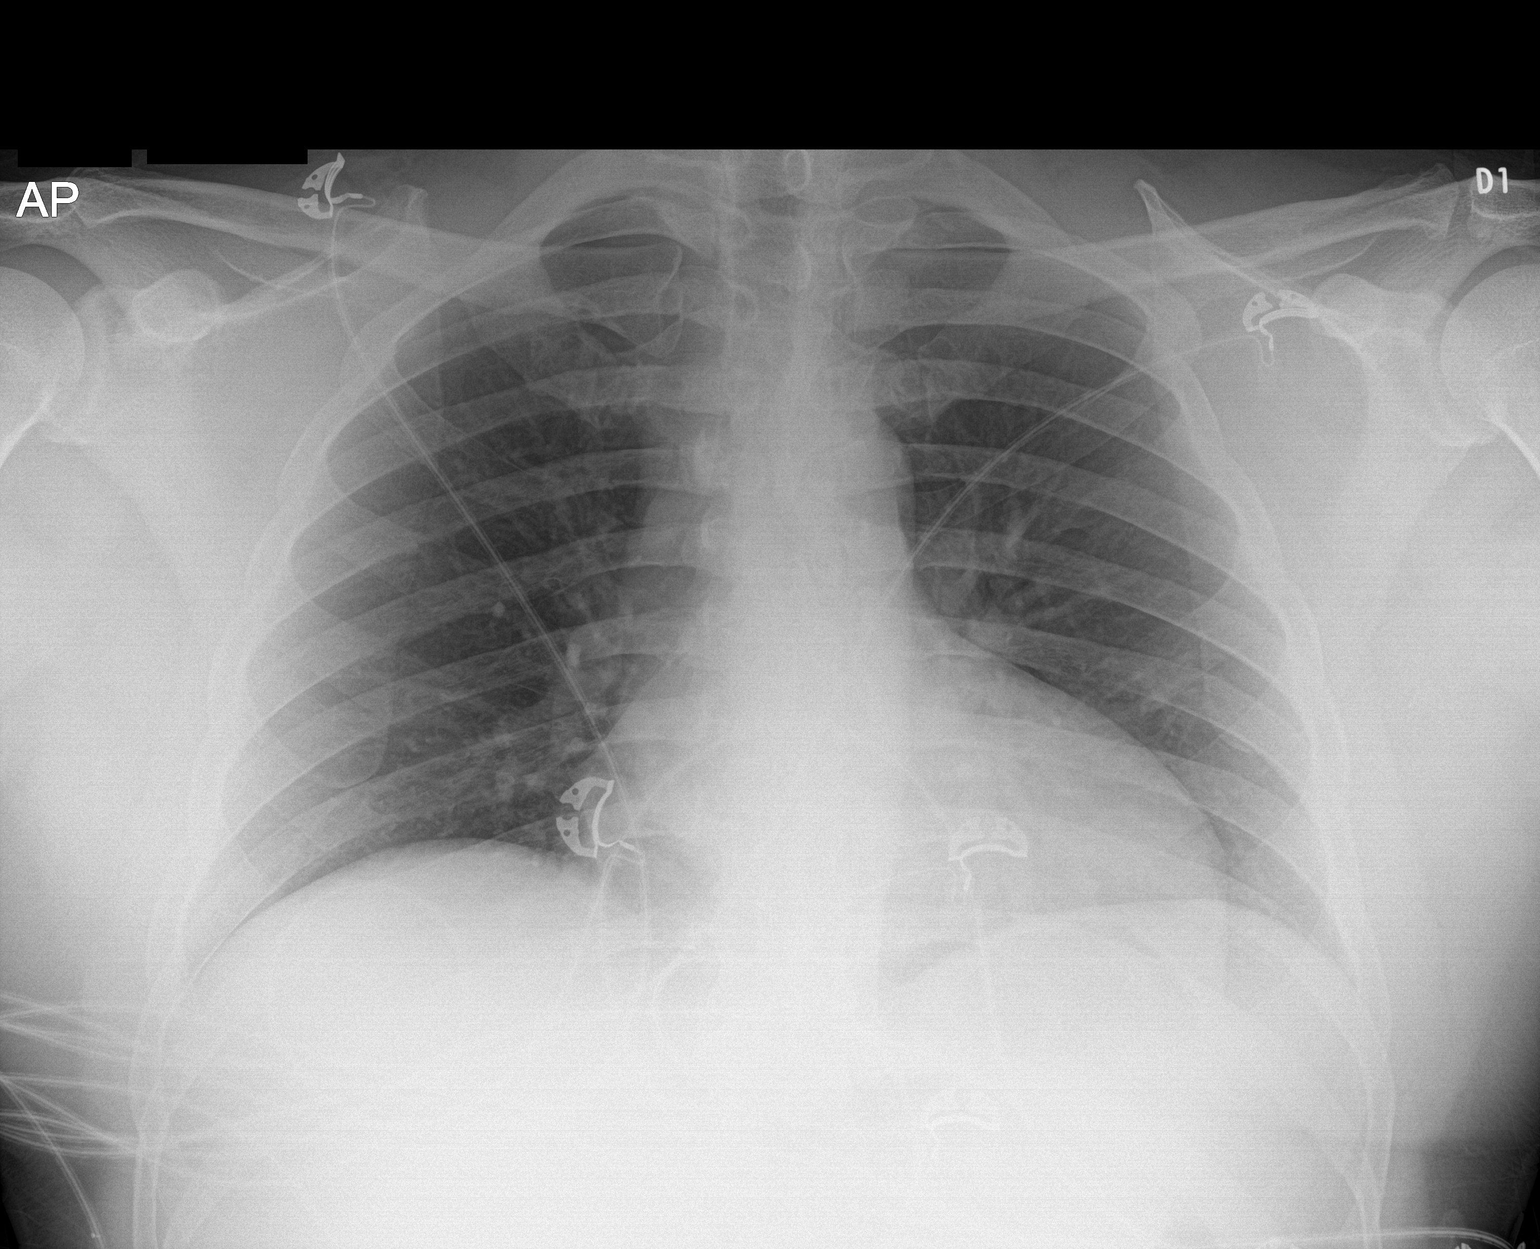

[1 of 1 positions shown; findings below may reference images not displayed]

FINDINGS: The heart size and mediastinal contours are within normal limits.
Both lungs are clear. The visualized skeletal structures are
unremarkable.
IMPRESSION: No active disease.

## 2021-08-25 MED ORDER — CARVEDILOL 3.125 MG PO TABS
3.125 MG | ORAL_TABLET | Freq: Two times a day (BID) | ORAL | 0 refills | Status: DC
Start: 2021-08-25 — End: 2021-11-22

## 2021-08-25 MED ORDER — VALSARTAN 80 MG PO TABS
80 MG | ORAL_TABLET | Freq: Every day | ORAL | 0 refills | Status: AC
Start: 2021-08-25 — End: 2021-11-22

## 2021-08-25 MED ORDER — CLOPIDOGREL BISULFATE 75 MG PO TABS
75 MG | ORAL_TABLET | Freq: Every day | ORAL | 0 refills | Status: DC
Start: 2021-08-25 — End: 2021-11-22

## 2021-08-25 MED ORDER — ATORVASTATIN CALCIUM 40 MG PO TABS
40 MG | ORAL_TABLET | Freq: Every evening | ORAL | 0 refills | Status: DC
Start: 2021-08-25 — End: 2021-11-22

## 2021-08-25 NOTE — Telephone Encounter (Signed)
Prescriptions sent to pharmacy until appointment in October//brendab  Requested Prescriptions     Signed Prescriptions Disp Refills    atorvastatin (LIPITOR) 40 MG tablet 90 tablet 0     Sig: Take 1 tablet by mouth nightly     Authorizing Provider: Joelene Millin     Ordering User: Binnie Kand, Aleshka Corney    carvedilol (COREG) 3.125 MG tablet 180 tablet 0     Sig: Take 1 tablet by mouth 2 times daily (with meals)     Authorizing Provider: Joelene Millin     Ordering User: Alain Honey    clopidogrel (PLAVIX) 75 MG tablet 90 tablet 0     Sig: Take 1 tablet by mouth daily     Authorizing Provider: Joelene Millin     Ordering User: Binnie Kand, Lerline Valdivia    valsartan (DIOVAN) 80 MG tablet 90 tablet 0     Sig: Take 1 tablet by mouth daily     Authorizing Provider: Joelene Millin     Ordering User: Alain Honey

## 2021-08-25 NOTE — Telephone Encounter (Signed)
Pt needs refills for all of his meds sent to the [facility] in [place].

## 2021-10-21 ENCOUNTER — Encounter: Attending: Cardiovascular Disease | Primary: Sports Medicine

## 2021-11-03 ENCOUNTER — Encounter: Attending: Cardiovascular Disease | Primary: Sports Medicine

## 2021-11-22 MED ORDER — CLOPIDOGREL BISULFATE 75 MG PO TABS
75 MG | ORAL_TABLET | Freq: Every day | ORAL | 0 refills | Status: AC
Start: 2021-11-22 — End: 2021-12-20

## 2021-11-22 MED ORDER — CARVEDILOL 3.125 MG PO TABS
3.125 MG | ORAL_TABLET | Freq: Two times a day (BID) | ORAL | 0 refills | Status: AC
Start: 2021-11-22 — End: 2022-01-19

## 2021-11-22 MED ORDER — CLOPIDOGREL BISULFATE 75 MG PO TABS
75 MG | ORAL_TABLET | Freq: Every day | ORAL | 0 refills | Status: AC
Start: 2021-11-22 — End: 2022-01-19

## 2021-11-22 MED ORDER — VALSARTAN 80 MG PO TABS
80 MG | ORAL_TABLET | Freq: Every day | ORAL | 0 refills | Status: AC
Start: 2021-11-22 — End: 2022-01-19

## 2021-11-22 MED ORDER — ATORVASTATIN CALCIUM 40 MG PO TABS
40 MG | ORAL_TABLET | Freq: Every evening | ORAL | 0 refills | Status: AC
Start: 2021-11-22 — End: 2022-01-19

## 2021-11-22 NOTE — Telephone Encounter (Signed)
Medications verified and eScribed to preferred pharm, [facility]   Atorvastatin 40mg  #90 1 Q HS  Clopidogrel 75mg  #90 1 QD  Valsartan 80mg  #90 1 QD   Carvedilol 3.125mg  #180 1 BID

## 2021-11-22 NOTE — Telephone Encounter (Signed)
Patient left message stating he needs ALL his medication filled with Dr. Herbert Seta for a 90 day supply to [facility] in [place]

## 2021-12-20 MED ORDER — CARVEDILOL 3.125 MG PO TABS
3.125 MG | ORAL_TABLET | Freq: Two times a day (BID) | ORAL | 0 refills | Status: DC
Start: 2021-12-20 — End: 2022-01-19

## 2021-12-20 MED ORDER — ATORVASTATIN CALCIUM 40 MG PO TABS
40 MG | ORAL_TABLET | Freq: Every evening | ORAL | 0 refills | Status: DC
Start: 2021-12-20 — End: 2022-01-19

## 2021-12-20 MED ORDER — VALSARTAN 80 MG PO TABS
80 MG | ORAL_TABLET | Freq: Every day | ORAL | 0 refills | Status: DC
Start: 2021-12-20 — End: 2022-01-19

## 2021-12-20 MED ORDER — CLOPIDOGREL BISULFATE 75 MG PO TABS
75 MG | ORAL_TABLET | Freq: Every day | ORAL | 0 refills | Status: DC
Start: 2021-12-20 — End: 2022-01-19

## 2021-12-20 NOTE — Telephone Encounter (Signed)
Prescriptions sent to [facility] pharmacy//brendab  Requested Prescriptions     Signed Prescriptions Disp Refills    atorvastatin (LIPITOR) 40 MG tablet 90 tablet 0     Sig: Take 1 tablet by mouth nightly     Authorizing Provider: Charlotte Sanes     Ordering User: Hollie Salk, Lamond Glantz    carvedilol (COREG) 3.125 MG tablet 180 tablet 0     Sig: Take 1 tablet by mouth 2 times daily (with meals)     Authorizing Provider: Charlotte Sanes     Ordering User: Hollie Salk, Jocelyn Lowery    valsartan (DIOVAN) 80 MG tablet 90 tablet 0     Sig: Take 1 tablet by mouth daily     Authorizing Provider: Charlotte Sanes     Ordering User: Synetta Fail    clopidogrel (PLAVIX) 75 MG tablet 90 tablet 0     Sig: Take 1 tablet by mouth daily     Authorizing Provider: Charlotte Sanes     Ordering User: Synetta Fail

## 2022-01-19 ENCOUNTER — Encounter: Admit: 2022-01-19 | Discharge: 2022-01-19 | Attending: Cardiovascular Disease | Primary: Sports Medicine

## 2022-01-19 DIAGNOSIS — I10 Essential (primary) hypertension: Secondary | ICD-10-CM

## 2022-01-19 MED ORDER — CLOPIDOGREL BISULFATE 75 MG PO TABS
75 MG | ORAL_TABLET | Freq: Every day | ORAL | 3 refills | Status: DC
Start: 2022-01-19 — End: 2023-02-22

## 2022-01-19 MED ORDER — VALSARTAN 80 MG PO TABS
80 MG | ORAL_TABLET | Freq: Every day | ORAL | 3 refills | Status: DC
Start: 2022-01-19 — End: 2023-02-22

## 2022-01-19 MED ORDER — ATORVASTATIN CALCIUM 40 MG PO TABS
40 MG | ORAL_TABLET | Freq: Every evening | ORAL | 3 refills | Status: AC
Start: 2022-01-19 — End: 2023-02-01

## 2022-01-19 MED ORDER — CARVEDILOL 3.125 MG PO TABS
3.125 MG | ORAL_TABLET | Freq: Two times a day (BID) | ORAL | 3 refills | Status: DC
Start: 2022-01-19 — End: 2023-02-22

## 2022-01-19 MED ORDER — NITROGLYCERIN 0.4 MG SL SUBL
0.4 MG | ORAL_TABLET | SUBLINGUAL | 3 refills | Status: DC
Start: 2022-01-19 — End: 2023-06-18

## 2022-01-19 NOTE — Progress Notes (Signed)
[facility]  [address], [address]  [place], [place] [address]  PHONE: [phone]    Christopher Downs  12-09-1980      SUBJECTIVE:   Christopher Downs is a 42 y.o. male seen for a follow up visit regarding the following:     Chief Complaint   Patient presents with    Coronary Artery Disease    Hypertension       HPI:    Christopher Downs presents for routine follow up for known Coronary Artery Disease. Last seen 04/15/21.  Multiple issues addressed as outlined below:     Coronary Artery Disease:  Patient denies any recent angina.  Notes compliance with medical therapy.  No recent NTG use.  No intolerance to anti-platelet therapy. No blood in stool or melena.     Hypertension:  Ambulatory BP readings have been controlled.  Patient reports compliance with medical therapy without side effects.      Hyperlipidemia:      Latest Reference Range & Units 04/15/21 11:16   Cholesterol, Total <200 MG/DL 237   HDL Cholesterol 40 - 60 MG/DL 50   LDL Calculated <628 MG/DL 31.5   Triglycerides 35 - 150 MG/DL 176     Exercise:  Staying active.  Has home gym. Starting new job and hopes to get insurance coverage.      Past Medical History, Past Surgical History, Family history, Social History, and Medications were all reviewed with the patient today and updated as necessary.           Current Outpatient Medications:     atorvastatin (LIPITOR) 40 MG tablet, Take 1 tablet by mouth nightly, Disp: 90 tablet, Rfl: 3    carvedilol (COREG) 3.125 MG tablet, Take 1 tablet by mouth 2 times daily (with meals), Disp: 180 tablet, Rfl: 3    clopidogrel (PLAVIX) 75 MG tablet, Take 1 tablet by mouth daily, Disp: 90 tablet, Rfl: 3    nitroGLYCERIN (NITROSTAT) 0.4 MG SL tablet, up to max of 3 total doses. If no relief after 1 dose, call 911., Disp: 25 tablet, Rfl: 3    valsartan (DIOVAN) 80 MG tablet, Take 1 tablet by mouth daily, Disp: 90 tablet, Rfl: 3    aspirin 81 MG chewable tablet, Take 1 tablet by mouth daily, Disp: , Rfl:      No  Known Allergies     Patient Active Problem List    Diagnosis Date Noted    Coronary artery disease involving native coronary artery of native heart 07/14/2020     Priority: High     INDEFINITE DAPT DUE TO STENT THROMBOSIS  1.  NSTEMI 12/01/19:  4 x 15 mm DES.   2.  NSTEMI 07/14/20:  PCI of RCA using a 4.5 x 26 mm resolute Onyx drug-eluting stent.  Prior stent was underexpanded.  Postdilated to 5.0 mm.  Post interventional IVUS.  All disease in other segments.  EF 45-50% inferior akinesis  3.  Echo (07/14/20):  EF 50-55%.  Inferior HK.  Normal diastolic function.  Mild MR.       Hypertension 07/14/2020     Priority: High    Hyperglycemia 07/14/2020     Priority: High    Mixed hyperlipidemia 07/29/2020     Priority: Medium        Social History     Tobacco Use    Smoking status: Never    Smokeless tobacco: Never   Substance Use Topics    Alcohol use: Not on file  ROS:    Review of Systems   Constitutional: Negative for malaise/fatigue.   Cardiovascular:  Negative for chest pain.   Respiratory:  Negative for shortness of breath.    Musculoskeletal:  Negative for arthritis.   Neurological:  Negative for focal weakness.   Psychiatric/Behavioral:  Negative for depression.            PHYSICAL EXAM:  Wt Readings from Last 3 Encounters:   01/19/22 119.7 kg (263 lb 12.8 oz)   04/15/21 115.2 kg (254 lb)   09/16/20 112.3 kg (247 lb 9.6 oz)     BP Readings from Last 3 Encounters:   01/19/22 136/86   04/15/21 132/88   09/16/20 118/88     Pulse Readings from Last 3 Encounters:   01/19/22 53   04/15/21 68   09/16/20 76       Physical Exam  Constitutional:       General: He is not in acute distress.  Neck:      Vascular: No carotid bruit.   Cardiovascular:      Rate and Rhythm: Normal rate and regular rhythm.   Pulmonary:      Effort: No respiratory distress.      Breath sounds: Normal breath sounds.   Abdominal:      General: Bowel sounds are normal.      Palpations: Abdomen is soft.   Musculoskeletal:         General: No  swelling.   Skin:     General: Skin is warm and dry.   Neurological:      General: No focal deficit present.   Psychiatric:         Mood and Affect: Mood normal.         Medical problems and test results were reviewed with the patient today.       Lab Results   Component Value Date    WBC 4.0 (L) 04/15/2021    HGB 15.2 04/15/2021    HCT 45.9 04/15/2021    MCV 91.1 04/15/2021    PLT 311 04/15/2021       Lab Results   Component Value Date/Time    NA 138 04/15/2021 11:16 AM    K 4.1 04/15/2021 11:16 AM    CL 108 04/15/2021 11:16 AM    CO2 27 04/15/2021 11:16 AM    BUN 10 04/15/2021 11:16 AM    CREATININE 1.00 04/15/2021 11:16 AM    GLUCOSE 142 04/15/2021 11:16 AM    CALCIUM 9.4 04/15/2021 11:16 AM        Lab Results   Component Value Date    CHOL 161 04/15/2021     Lab Results   Component Value Date    TRIG 147 04/15/2021     Lab Results   Component Value Date    HDL 50 04/15/2021     Lab Results   Component Value Date    LDLCALC 81.6 04/15/2021     Lab Results   Component Value Date    LABVLDL 29.4 (H) 04/15/2021     Lab Results   Component Value Date    CHOLHDLRATIO 3.2 04/15/2021        Data from outside records/labs from outside providers have been reviewed and summarized as noted in the HPI, past history and data review sections of this note     EKG done today and reviewed with patient showed:  Sinus Bradycardia   -Inferior infarct -age undetermined.  Rate 53    ASSESSMENT  and PLAN      1. Hypertension  Currently BP appears to be under acceptable control on current therapy.  Continue current medications including Coreg and Diovan.   Discussed monitoring ambulatory BP readings to ensure continued optimal management.  If BP becomes elevated, patient is encouraged to contact my office for further titration of therapy.      - EKG 12 Lead  - carvedilol (COREG) 3.125 MG tablet; Take 1 tablet by mouth 2 times daily (with meals)  Dispense: 180 tablet; Refill: 3  - valsartan (DIOVAN) 80 MG tablet; Take 1 tablet by mouth  daily  Dispense: 90 tablet; Refill: 3    2. Coronary artery disease involving native coronary artery of native heart without angina pectoris  Patient is doing well without any anginal symptoms.  Continue Aspirin 81 mg a day. Plavix 75 mg a day and PRN NTG.  We discussed the importance of continued risk factor modification.  The patient is encouraged to contact my office with any worsening dyspnea or chest discomfort.   - clopidogrel (PLAVIX) 75 MG tablet; Take 1 tablet by mouth daily  Dispense: 90 tablet; Refill: 3  - nitroGLYCERIN (NITROSTAT) 0.4 MG SL tablet; up to max of 3 total doses. If no relief after 1 dose, call 911.  Dispense: 25 tablet; Refill: 3    3. Mixed hyperlipidemia  Labs prior to next visit.   - atorvastatin (LIPITOR) 40 MG tablet; Take 1 tablet by mouth nightly  Dispense: 90 tablet; Refill: 3  - Comprehensive Metabolic Panel; Future  - Lipid Panel; Future  - CBC; Future             Return in about 6 months (around 07/20/2022).       Renelda Loma, MD  01/19/2022  9:17 AM    This note may have been dictated using speech recognition software.  As a result, error of speech recognition may have occurred

## 2022-05-02 NOTE — Telephone Encounter (Signed)
Patient has been feeling very fatigue and some dizziness that has been going on since Saturday. His family is fixing to travel to [place] and wants to make sure ok to travel. Please call to advise

## 2022-05-02 NOTE — Telephone Encounter (Signed)
Patient called stating that he has been feeling fatigue and dizzy after having 2 alcoholic beverages for his birthday Friday night. Patient states that he woke up Saturday morning feeling like this. Patient states that he is drinking water and sometimes adds "liquid IV" a hydration multiplier. Patient states that when he bends down to pick up something, he can feel the dizziness when he stands up. Denies any chest pain or pressure, but is very fatigue.   Patient will get his blood pressure and pulse reading, call me back//brendab

## 2022-05-02 NOTE — Telephone Encounter (Signed)
Not sure why he feels dizzy.  His blood pressure is not low.  Would avoid alcohol stay hydrated.  May need to see acute care or his PCP to evaluate for noncardiac causes     Patient called with Dr. Lincoln Maxin response.patient voiced understanding patient also asked if the lab would have his blood type patient informed that he would have to have his PCP draw for that test. Patient voiced understanding and thanked me//Christopher Downs

## 2022-05-02 NOTE — Telephone Encounter (Signed)
Patient calling back with 3 BP reading. 131/97 PR 92, 129/93 PR90, 121/93 PR 86

## 2022-05-16 NOTE — Telephone Encounter (Addendum)
Pt.wife says Dr.Nessmith told her to call if pt.not feeling better.Pt.came back from [facility] Friday the following day was very weak and balance was off.Started vomiting.Been sleeping daily and not working due to weakness.Has an appt.w/PCP tomorrow 9am.    I told the wife that I will call back tomorrow after pt.sees PCP for evaluation.    05/17/22 I spoke w/pt.wife she said that pt.was seen at Urgent Care and diagnosed w/diabetes blood sugar 1000 was sent to the ER.They will call PRN.

## 2022-05-16 NOTE — Telephone Encounter (Signed)
Pt talked to Dr Theressa Millard who told him if he didn't feel better to call him. Christopher Downs is calling as pt is not feeling any better.  Said pt won't admit it but he is not holding down food, just fruit.  Threw up, so keeping to fruit.  Feels like he did after surgery, very weak.

## 2022-05-22 NOTE — Telephone Encounter (Signed)
Went to [facility] with elevated blood sugars and they told him to let Dr Theressa Millard know he is now on insulin . Please call

## 2022-05-22 NOTE — Telephone Encounter (Signed)
Pt called to inform Dr.Nessmith that he was admitted to the [facility] ER with past week due to elevated BS (450) and will now be taking insuline.

## 2022-05-23 LAB — HEMOGLOBIN A1C
Estimated Avg Glucose, External: 381 mg/dL
Hemoglobin A1C, External: 14.9 % — ABNORMAL HIGH (ref <5.7)

## 2022-07-27 ENCOUNTER — Ambulatory Visit
Admit: 2022-07-27 | Discharge: 2022-07-27 | Payer: BLUE CROSS/BLUE SHIELD | Attending: Cardiovascular Disease | Primary: Family

## 2022-07-27 VITALS — BP 126/86 | HR 70 | Ht 74.0 in | Wt 258.6 lb

## 2022-07-27 DIAGNOSIS — E119 Type 2 diabetes mellitus without complications: Secondary | ICD-10-CM

## 2022-07-27 DIAGNOSIS — Z794 Long term (current) use of insulin: Secondary | ICD-10-CM

## 2022-07-27 NOTE — Progress Notes (Signed)
[facility]  [address], [address]  [place], [place] [address]  PHONE: [phone]    Christopher Downs  08-15-1980      SUBJECTIVE:   Christopher Downs is a 42 y.o. male seen for a follow up visit regarding the following:     Chief Complaint   Patient presents with    Hypertension    Coronary Artery Disease       HPI:    Christopher Downs presents for routine follow up for known Coronary Artery Disease. Multiple issues addressed.     Coronary Artery Disease:  Patient denies any recent angina.  Notes compliance with medical therapy.  No recent NTG use.  No intolerance to anti-platelet therapy. No recent echo.       Hypertension:  Ambulatory BP readings have been controlled.  Patient reports compliance with medical therapy without side effects.      Hyperlipidemia:   Tolerating Lipitor.  Lipid panel 05/23/22:  Tchol 163, HDL 37 and LDL 89.      Diabetes: Admitted in May to [facility] with diabetic ketoacidosis.  Now being followed by endocrinology on insulin. Significantly improved.  Has continuous monitor.        Past Medical History, Past Surgical History, Family history, Social History, and Medications were all reviewed with the patient today and updated as necessary.           Current Outpatient Medications:     LANTUS SOLOSTAR 100 UNIT/ML injection pen, Inject 56 Units into the skin daily, Disp: , Rfl:     NOVOLOG FLEXPEN 100 UNIT/ML injection pen, 5 units subcutaneously before breakfast, 5 units subcutaneously before lunch, 45units subcutaneously before dinner + correction. Max 42 units per day, Disp: , Rfl:     atorvastatin (LIPITOR) 40 MG tablet, Take 1 tablet by mouth nightly, Disp: 90 tablet, Rfl: 3    carvedilol (COREG) 3.125 MG tablet, Take 1 tablet by mouth 2 times daily (with meals), Disp: 180 tablet, Rfl: 3    clopidogrel (PLAVIX) 75 MG tablet, Take 1 tablet by mouth daily, Disp: 90 tablet, Rfl: 3    nitroGLYCERIN (NITROSTAT) 0.4 MG SL tablet, up to max of 3 total doses. If no relief after 1  dose, call 911., Disp: 25 tablet, Rfl: 3    valsartan (DIOVAN) 80 MG tablet, Take 1 tablet by mouth daily, Disp: 90 tablet, Rfl: 3    aspirin 81 MG chewable tablet, Take 1 tablet by mouth daily, Disp: , Rfl:      No Known Allergies     Patient Active Problem List    Diagnosis Date Noted    Type 2 diabetes mellitus without complication, with long-term current use of insulin (HCC) 07/27/2022     Priority: High    Coronary artery disease involving native coronary artery of native heart 07/14/2020     Priority: High     INDEFINITE DAPT DUE TO STENT THROMBOSIS  1.  NSTEMI 12/01/19:  4 x 15 mm DES.   2.  NSTEMI 07/14/20:  PCI of RCA using a 4.5 x 26 mm resolute Onyx drug-eluting stent.  Prior stent was underexpanded.  Postdilated to 5.0 mm.  Post interventional IVUS.  All disease in other segments.  EF 45-50% inferior akinesis  3.  Echo (07/14/20):  EF 50-55%.  Inferior HK.  Normal diastolic function.  Mild MR.       Hypertension 07/14/2020     Priority: High    Mixed hyperlipidemia 07/29/2020     Priority: Medium  Social History     Tobacco Use    Smoking status: Never    Smokeless tobacco: Never   Substance Use Topics    Alcohol use: Not on file       ROS:    Review of Systems   Constitutional: Negative for malaise/fatigue.   Cardiovascular:  Negative for chest pain.   Respiratory:  Negative for shortness of breath.    Musculoskeletal:  Positive for arthritis.   Neurological:  Negative for focal weakness.   Psychiatric/Behavioral:  Negative for depression.            PHYSICAL EXAM:  Wt Readings from Last 3 Encounters:   07/27/22 117.3 kg (258 lb 9.6 oz)   01/19/22 119.7 kg (263 lb 12.8 oz)   04/15/21 115.2 kg (254 lb)     BP Readings from Last 3 Encounters:   07/27/22 126/86   01/19/22 136/86   04/15/21 132/88     Pulse Readings from Last 3 Encounters:   07/27/22 70   01/19/22 53   04/15/21 68       Physical Exam  Constitutional:       General: He is not in acute distress.  Neck:      Vascular: No carotid bruit.    Cardiovascular:      Rate and Rhythm: Normal rate and regular rhythm.   Pulmonary:      Effort: No respiratory distress.      Breath sounds: Normal breath sounds.   Abdominal:      General: Bowel sounds are normal.      Palpations: Abdomen is soft.   Musculoskeletal:         General: No swelling.   Skin:     General: Skin is warm and dry.   Neurological:      General: No focal deficit present.   Psychiatric:         Mood and Affect: Mood normal.         Medical problems and test results were reviewed with the patient today.       Lab Results   Component Value Date    WBC 4.0 (L) 04/15/2021    HGB 15.2 04/15/2021    HCT 45.9 04/15/2021    MCV 91.1 04/15/2021    PLT 311 04/15/2021       Lab Results   Component Value Date/Time    NA 138 04/15/2021 11:16 AM    K 4.1 04/15/2021 11:16 AM    CL 108 04/15/2021 11:16 AM    CO2 27 04/15/2021 11:16 AM    BUN 10 04/15/2021 11:16 AM    CREATININE 1.00 04/15/2021 11:16 AM    GLUCOSE 142 04/15/2021 11:16 AM    CALCIUM 9.4 04/15/2021 11:16 AM        Lab Results   Component Value Date    CHOL 161 04/15/2021     Lab Results   Component Value Date    TRIG 147 04/15/2021     Lab Results   Component Value Date    HDL 50 04/15/2021     No components found for: "LDLCHOLESTEROL", "LDLCALC"  Lab Results   Component Value Date    VLDL 29.4 (H) 04/15/2021     Lab Results   Component Value Date    CHOLHDLRATIO 3.2 04/15/2021        Data from outside records/labs from outside providers have been reviewed and summarized as noted in the HPI, past history and data review sections of this  note       ASSESSMENT and PLAN      1. Type 2 diabetes mellitus without complication, with long-term current use of insulin (HCC)  Continue insulin.  Working with endocrinology for appropriate control.    2. Mixed hyperlipidemia  Lipids are at goal.  Continue Lipitor.   Will continue current medical regimen and monitor for any side effects or complications of treatment. Discussed importance of healthy diet.        3. Coronary artery disease involving native coronary artery of native heart without angina pectoris  Patient is doing well without any anginal symptoms.  Continue Aspirin 81 mg a day, Plavix 75 mg a day and PRN NTG.  We discussed the importance of continued risk factor modification.  The patient is encouraged to contact my office with any worsening dyspnea or chest discomfort.  Check echo     4. Primary hypertension  Currently BP appears to be under acceptable control on current therapy.  Continue current medications including Coreg and Diovan.   Discussed monitoring ambulatory BP readings to ensure continued optimal management.  If BP becomes elevated, patient is encouraged to contact my office for further titration of therapy.                 No follow-ups on file.       Ebony Hail, MD  07/27/2022  9:13 AM    *Portions of the record have been created with voice recognition software. Occasional wrong-word or 'sound-a-like' substitutions may have occurred due to the inherent limitations of voice recognition software. Read the chart carefully and recognize, using context, where substitutions have occurred.

## 2023-01-23 ENCOUNTER — Ambulatory Visit: Admit: 2023-01-23 | Payer: BLUE CROSS/BLUE SHIELD | Primary: Family

## 2023-01-23 VITALS — BP 118/85 | Ht 74.0 in | Wt 258.0 lb

## 2023-01-23 DIAGNOSIS — E782 Mixed hyperlipidemia: Secondary | ICD-10-CM

## 2023-01-23 LAB — ECHO (TTE) COMPLETE (PRN CONTRAST/BUBBLE/STRAIN/3D)
AV Peak Gradient: 5 mmHg
AV Peak Velocity: 1.1 m/s
AV Velocity Ratio: 0.82
Ascending Aorta Index: 1.28 cm/m2
Ascending Aorta: 3.1 cm
Body Surface Area: 2.47 m2
E/E' Lateral: 9.07
E/E' Ratio (Averaged): 11.27
E/E' Septal: 13.46
EF BP: 55 % (ref 55–100)
Fractional Shortening 2D: 33 % (ref 28–44)
IVSd: 1.1 cm — AB (ref 0.6–1.0)
LA Area 2C: 18.5 cm2
LA Area 4C: 17.3 cm2
LA Diameter: 4.1 cm
LA Major Axis: 5.1 cm
LA Minor Axis: 5.3 cm
LA Size Index: 1.69 cm/m2
LA Volume BP: 51 mL (ref 18–58)
LA Volume Index BP: 21 mL/m2 (ref 16–34)
LA Volume Index MOD A2C: 21 mL/m2 (ref 16–34)
LA Volume Index MOD A4C: 20 mL/m2 (ref 16–34)
LA Volume MOD A2C: 52 mL (ref 18–58)
LA Volume MOD A4C: 48 mL (ref 18–58)
LV E' Lateral Velocity: 9.26 cm/s
LV E' Septal Velocity: 6.24 cm/s
LV EDV A2C: 105 mL
LV EDV A4C: 144 mL
LV EDV Index A2C: 43 mL/m2
LV EDV Index A4C: 60 mL/m2
LV ESV A2C: 50 mL
LV ESV A4C: 60 mL
LV ESV Index A2C: 21 mL/m2
LV ESV Index A4C: 25 mL/m2
LV Ejection Fraction A4C: 58 %
LV Mass 2D Index: 88.1 g/m2 (ref 49–115)
LV Mass 2D: 213.3 g (ref 88–224)
LV RWT Ratio: 0.49
LVIDd Index: 2.02 cm/m2
LVIDd: 4.9 cm (ref 4.2–5.9)
LVIDs Index: 1.36 cm/m2
LVIDs: 3.3 cm
LVOT Peak Gradient: 3 mmHg
LVOT Peak Velocity: 0.9 m/s
LVPWd: 1.2 cm — AB (ref 0.6–1.0)
MV A Velocity: 0.68 m/s
MV E Velocity: 0.84 m/s
MV E Wave Deceleration Time: 208 ms
MV E/A: 1.24
RV Basal Dimension: 4.2 cm
RV Mid Dimension: 3.5 cm
RVIDd: 3.5 cm

## 2023-01-23 MED ORDER — PERFLUTREN LIPID MICROSPHERE IV SUSP
Freq: Once | INTRAVENOUS | Status: AC
Start: 2023-01-23 — End: 2023-01-23
  Administered 2023-01-23: 15:00:00 1.5 mL via INTRAVENOUS

## 2023-01-31 ENCOUNTER — Encounter

## 2023-01-31 ENCOUNTER — Ambulatory Visit
Admit: 2023-01-31 | Discharge: 2023-01-31 | Payer: BLUE CROSS/BLUE SHIELD | Attending: Cardiovascular Disease | Primary: Family

## 2023-01-31 VITALS — BP 120/80 | HR 60 | Ht 74.0 in | Wt 253.0 lb

## 2023-01-31 DIAGNOSIS — I10 Essential (primary) hypertension: Secondary | ICD-10-CM

## 2023-01-31 LAB — LIPID PANEL
Chol/HDL Ratio: 3.3 (ref 0.0–5.0)
Cholesterol, Total: 128 mg/dL (ref 0–200)
HDL: 39 mg/dL — ABNORMAL LOW (ref 40–60)
LDL Cholesterol: 75 mg/dL (ref 0–100)
Triglycerides: 73 mg/dL (ref 0–150)
VLDL Cholesterol Calculated: 15 mg/dL (ref 6–23)

## 2023-01-31 MED ORDER — EVOLOCUMAB 140 MG/ML SC SOSY
140 | SUBCUTANEOUS | Status: AC
Start: 2023-01-31 — End: ?

## 2023-01-31 NOTE — Progress Notes (Signed)
[facility]  [address], [address]  [place], [place] [address]  PHONE: [phone]    [name]  10/06/1980      SUBJECTIVE:   Christopher Downs is a 43 y.o. male seen for a follow up visit regarding the following:     Chief Complaint   Patient presents with    Coronary Artery Disease    Hypertension       HPI:    Christopher Downs presents for routine follow. Multiple issues addressed as outlined below:     Coronary Artery Disease  Status:  Patient denies any recent angina.  Notes compliance with medical therapy.  No recent NTG use.  Recent echo with normal LV function.   Medications:  Aspirin 81 mg QD  Prior History:  INDEFINITE DAPT DUE TO STENT THROMBOSIS  1.  NSTEMI 12/01/19:  4 x 15 mm DES.   2.  NSTEMI 07/14/20: PCI of RCA using a 4.5 x 26 mm resolute Onyx drug-eluting stent.  Prior stent was underexpanded.  Postdilated to 5.0 mm.  Post interventional IVUS.  All disease in other segments.  EF 45-50% inferior akinesis  3.  Echo (07/14/20):  EF 50-55%.  Inferior HK.  Normal diastolic function.  Mild MR.   4.  Echo (01/23/23):  EF 55-60%.  Normal diastolic function.  No significant valve disease.     Hypertension  Status:  Ambulatory BP readings have been controlled.  Patient reports compliance with medical therapy without side effects.    Medications: Coreg 3.125 mg BID.  Diovan 80 mg QD  Labs: CMP normal in August.    Hyperlipidemia  Status:  Cholesterol panel  is outlined below and patient is tolerating current statin therapy..  Medications: Lipitor 40 mg QD  Labs:   Cholesterol  100 - 199 mg/dL 295    Triglycerides  0 - 149 mg/dL 188    HDL Cholesterol  >39 mg/dL 39 Low     VLDL Cholesterol  5 - 40 mg/dL 20    LDL, Calculated  0 - 99 mg/dL 416 High         Diabetes Mellitus  Status:  Off Insulin.  Working on diet and exercise. Has Libre monitor.  States glucose well controlled.  Hemoglobin A1c in November was 6.4    EKG done today and reviewed with patient showed:  Sinus Rhythm   -Inferior  infarct -age undetermined   Rate 60           Past Medical History, Past Surgical History, Family history, Social History, and Medications were all reviewed with the patient today and updated as necessary.           Current Outpatient Medications:     atorvastatin (LIPITOR) 40 MG tablet, Take 1 tablet by mouth nightly, Disp: 90 tablet, Rfl: 3    carvedilol (COREG) 3.125 MG tablet, Take 1 tablet by mouth 2 times daily (with meals), Disp: 180 tablet, Rfl: 3    clopidogrel (PLAVIX) 75 MG tablet, Take 1 tablet by mouth daily, Disp: 90 tablet, Rfl: 3    nitroGLYCERIN (NITROSTAT) 0.4 MG SL tablet, up to max of 3 total doses. If no relief after 1 dose, call 911., Disp: 25 tablet, Rfl: 3    valsartan (DIOVAN) 80 MG tablet, Take 1 tablet by mouth daily, Disp: 90 tablet, Rfl: 3    aspirin 81 MG chewable tablet, Take 1 tablet by mouth daily, Disp: , Rfl:      No Known Allergies  Patient Active Problem List    Diagnosis Date Noted    Type 2 diabetes mellitus without complication, with long-term current use of insulin (HCC) 07/27/2022     Priority: High    Coronary artery disease involving native coronary artery of native heart 07/14/2020     Priority: High     INDEFINITE DAPT DUE TO STENT THROMBOSIS  1.  NSTEMI 12/01/19:  4 x 15 mm DES.   2.  NSTEMI 07/14/20:  PCI of RCA using a 4.5 x 26 mm resolute Onyx drug-eluting stent.  Prior stent was underexpanded.  Postdilated to 5.0 mm.  Post interventional IVUS.  All disease in other segments.  EF 45-50% inferior akinesis  3.  Echo (07/14/20):  EF 50-55%.  Inferior HK.  Normal diastolic function.  Mild MR.   4.  Echo (01/23/23):  EF 55-60%.  Normal diastolic function.  No significant valve disease.       Hypertension 07/14/2020     Priority: High    Mixed hyperlipidemia 07/29/2020     Priority: Medium        Social History     Tobacco Use    Smoking status: Never    Smokeless tobacco: Never   Substance Use Topics    Alcohol use: Not on file       ROS:    Review of Systems    Constitutional: Negative for malaise/fatigue.   Cardiovascular:  Negative for chest pain.   Respiratory:  Negative for shortness of breath.    Musculoskeletal:  Positive for arthritis.   Neurological:  Negative for focal weakness.   Psychiatric/Behavioral:  Negative for depression.            PHYSICAL EXAM:  Wt Readings from Last 3 Encounters:   01/31/23 114.8 kg (253 lb)   01/23/23 117 kg (258 lb)   07/27/22 117.3 kg (258 lb 9.6 oz)     BP Readings from Last 3 Encounters:   01/31/23 120/80   01/23/23 118/85   07/27/22 126/86     Pulse Readings from Last 3 Encounters:   01/31/23 60   07/27/22 70   01/19/22 53       Physical Exam  Constitutional:       General: He is not in acute distress.  Neck:      Vascular: No carotid bruit.   Cardiovascular:      Rate and Rhythm: Normal rate and regular rhythm.   Pulmonary:      Effort: No respiratory distress.      Breath sounds: Normal breath sounds.   Abdominal:      General: Bowel sounds are normal.      Palpations: Abdomen is soft.   Musculoskeletal:         General: No swelling.   Skin:     General: Skin is warm and dry.   Neurological:      General: No focal deficit present.   Psychiatric:         Mood and Affect: Mood normal.         Medical problems and test results were reviewed with the patient today.       Lab Results   Component Value Date    WBC 4.0 (L) 04/15/2021    HGB 15.2 04/15/2021    HCT 45.9 04/15/2021    MCV 91.1 04/15/2021    PLT 311 04/15/2021       Lab Results   Component Value Date/Time    NA 138 04/15/2021  11:16 AM    K 4.1 04/15/2021 11:16 AM    CL 108 04/15/2021 11:16 AM    CO2 27 04/15/2021 11:16 AM    BUN 10 04/15/2021 11:16 AM    CREATININE 1.00 04/15/2021 11:16 AM    GLUCOSE 142 04/15/2021 11:16 AM    CALCIUM 9.4 04/15/2021 11:16 AM        Lab Results   Component Value Date    CHOL 161 04/15/2021     Lab Results   Component Value Date    TRIG 147 04/15/2021     Lab Results   Component Value Date    HDL 50 04/15/2021     No components found for:  "LDLCHOLESTEROL", "LDLCALC"  Lab Results   Component Value Date    VLDL 29.4 (H) 04/15/2021     Lab Results   Component Value Date    CHOLHDLRATIO 3.2 04/15/2021        Data from outside records/labs from outside providers have been reviewed and summarized as noted in the HPI, past history and data review sections of this note       ASSESSMENT and PLAN    Mallie was seen today for coronary artery disease and hypertension.    Diagnoses and all orders for this visit:    Primary hypertension  -     EKG 12 Lead    Coronary artery disease involving native coronary artery of native heart without angina pectoris    Type 2 diabetes mellitus without complication, with long-term current use of insulin (HCC)    Mixed hyperlipidemia  -     Lipid Panel; Future  -     Evolocumab (REPATHA) syringe 140 mg        Overall Impression    1. Primary hypertension  Blood pressure appears to be well-controlled.  Continue carvedilol and Diovan therapy.  - EKG 12 Lead    2. Coronary artery disease involving native coronary artery of native heart without angina pectoris  Patient is doing well without any anginal symptoms.  Continue Aspirin 81 mg a day, Plavix 75 mg QD and PRN NTG.  We discussed the importance of continued risk factor modification.  The patient is encouraged to contact my office with any worsening dyspnea or chest discomfort.      3. Type 2 diabetes mellitus without complication, with long-term current use of insulin (HCC)  Glucose appears to be well-controlled off of insulin.  Continue diabetic diet and exercise program.  Follow closely with PCP.    4. Mixed hyperlipidemia  Lipids not optimal.  Discussed goal LDL less than 55.  He would like to recheck lipids to see if his panel is improved.  We discussed he will likely need to add an additional agent to his Lipitor.  Given prescription for Repatha.  - Lipid Panel; Future  - Evolocumab (REPATHA) syringe 140 mg             Return in about 6 months (around 07/31/2023).        Ebony Hail, MD  01/31/2023  11:22 AM    *Portions of the record have been created with voice recognition software. Occasional wrong-word or 'sound-a-like' substitutions may have occurred due to the inherent limitations of voice recognition software. Read the chart carefully and recognize, using context, where substitutions have occurred.

## 2023-01-31 NOTE — Other (Signed)
Please call patient.  Cholesterol is better than previous.  Still his LDL cholesterol is 75 when goal should be 55.  I think that we can accomplish this goal without Repatha if we add Zetia 10 mg and increase his Lipitor to 80 mg a day.  If he is willing to make these changes would cancel his Repatha prescription and recheck lipid panel prior to his next visit  Thank you

## 2023-02-01 MED ORDER — ATORVASTATIN CALCIUM 80 MG PO TABS
80 | ORAL_TABLET | Freq: Every day | ORAL | 0 refills | 90.00 days | Status: DC
Start: 2023-02-01 — End: 2023-06-18

## 2023-02-01 MED ORDER — EZETIMIBE 10 MG PO TABS
10 MG | ORAL_TABLET | Freq: Every day | ORAL | 3 refills | Status: DC
Start: 2023-02-01 — End: 2023-06-18

## 2023-02-01 NOTE — Telephone Encounter (Signed)
Patient returned call, results given and medication change discussed. Patient voiced understanding and agrees. Patient will call with any question or concerns. Prescriptions sent to [facility]  Requested Prescriptions     Signed Prescriptions Disp Refills    ezetimibe (ZETIA) 10 MG tablet 30 tablet 3     Sig: Take 1 tablet by mouth daily     Authorizing Provider: Joelene Millin     Ordering User: Binnie Kand, Emery Dupuy    atorvastatin (LIPITOR) 80 MG tablet 90 tablet 0     Sig: Take 1 tablet by mouth daily     Authorizing Provider: Joelene Millin     Ordering User: Alain Honey

## 2023-02-01 NOTE — Telephone Encounter (Signed)
-----   Message from Dr. Burman Riis, MD sent at 01/31/2023  9:30 PM EST -----  Please call patient.  Cholesterol is better than previous.  Still his LDL cholesterol is 75 when goal should be 55.  I think that we can accomplish this goal without Repatha if we add Zetia 10 mg and increase his Lipitor to 80 mg a day.  If he is willing to make these changes would cancel his Repatha prescription and recheck lipid panel prior to his next visit  Thank you

## 2023-02-22 ENCOUNTER — Telehealth

## 2023-02-22 MED ORDER — VALSARTAN 80 MG PO TABS
80 MG | ORAL_TABLET | Freq: Every day | ORAL | 3 refills | Status: DC
Start: 2023-02-22 — End: 2023-06-18

## 2023-02-22 MED ORDER — CLOPIDOGREL BISULFATE 75 MG PO TABS
75 MG | ORAL_TABLET | Freq: Every day | ORAL | 3 refills | Status: DC
Start: 2023-02-22 — End: 2023-06-18

## 2023-02-22 MED ORDER — CARVEDILOL 3.125 MG PO TABS
3.125 | ORAL_TABLET | Freq: Two times a day (BID) | ORAL | 3 refills | 30.00 days | Status: DC
Start: 2023-02-22 — End: 2023-06-18

## 2023-02-22 NOTE — Telephone Encounter (Signed)
 Prescriptions sent to [facility]  Requested Prescriptions     Signed Prescriptions Disp Refills    carvedilol (COREG) 3.125 MG tablet 180 tablet 3     Sig: Take 1 tablet by mouth 2 times daily (with meals)     Authorizing Provider: Joelene Millin     Ordering User: Alain Honey    clopidogrel (PLAVIX) 75 MG tablet 90 tablet 3     Sig: Take 1 tablet by mouth daily     Authorizing Provider: Joelene Millin     Ordering User: Binnie Kand, Alica Shellhammer    valsartan (DIOVAN) 80 MG tablet 90 tablet 3     Sig: Take 1 tablet by mouth daily     Authorizing Provider: Joelene Millin     Ordering User: Alain Honey

## 2023-02-22 NOTE — Telephone Encounter (Signed)
 MEDICATION REFILL REQUEST      Name of Medication:  Valsartan  Dose:  80 mg  Frequency:  QD  Quantity:  90  Days' supply:  90 with 3 refills      Pharmacy Name/Location:  [facility][phone]    Pt said if any of his other meds look like they need refills to please call them in also

## 2023-06-18 ENCOUNTER — Telehealth

## 2023-06-18 MED ORDER — VALSARTAN 80 MG PO TABS
80 | ORAL_TABLET | Freq: Every day | ORAL | 3 refills | 90.00000 days | Status: DC
Start: 2023-06-18 — End: 2024-02-15

## 2023-06-18 MED ORDER — CLOPIDOGREL BISULFATE 75 MG PO TABS
75 | ORAL_TABLET | Freq: Every day | ORAL | 3 refills | 30.00 days | Status: AC
Start: 2023-06-18 — End: ?

## 2023-06-18 MED ORDER — NITROGLYCERIN 0.4 MG SL SUBL
0.4 | ORAL_TABLET | SUBLINGUAL | 3 refills | 8.00 days | Status: AC
Start: 2023-06-18 — End: ?

## 2023-06-18 MED ORDER — CARVEDILOL 3.125 MG PO TABS
3.125 | ORAL_TABLET | Freq: Two times a day (BID) | ORAL | 3 refills | 30.00 days | Status: AC
Start: 2023-06-18 — End: ?

## 2023-06-18 MED ORDER — ATORVASTATIN CALCIUM 80 MG PO TABS
80 | ORAL_TABLET | Freq: Every day | ORAL | 3 refills | 90.00 days | Status: AC
Start: 2023-06-18 — End: ?

## 2023-06-18 MED ORDER — EZETIMIBE 10 MG PO TABS
10 | ORAL_TABLET | Freq: Every day | ORAL | 3 refills | 90.00 days | Status: AC
Start: 2023-06-18 — End: ?

## 2023-06-18 NOTE — Telephone Encounter (Signed)
 Prescriptions [facility] to [email or web address]  Requested Prescriptions     Signed Prescriptions Disp Refills    atorvastatin  (LIPITOR ) 80 MG tablet 90 tablet 3     Sig: Take 1 tablet by mouth daily     Authorizing Provider: NESSMITH, MATTHEW G     Ordering User: Lauralee Waters    carvedilol  (COREG ) 3.125 MG tablet 180 tablet 3     Sig: Take 1 tablet by mouth 2 times daily (with meals)     Authorizing Provider: Burleigh Carp     Ordering User: Debria Fang    clopidogrel  (PLAVIX ) 75 MG tablet 90 tablet 3     Sig: Take 1 tablet by mouth daily     Authorizing Provider: Burleigh Carp     Ordering User: Debria Fang    ezetimibe  (ZETIA ) 10 MG tablet 90 tablet 3     Sig: Take 1 tablet by mouth daily     Authorizing Provider: Burleigh Carp     Ordering User: Hildegard Low, Earlyne Feeser    valsartan  (DIOVAN ) 80 MG tablet 90 tablet 3     Sig: Take 1 tablet by mouth daily     Authorizing Provider: Burleigh Carp     Ordering User: Debria Fang    nitroGLYCERIN  (NITROSTAT ) 0.4 MG SL tablet 25 tablet 3     Sig: up to max of 3 total doses. If no relief after 1 dose, call 911.     Authorizing Provider: Burleigh Carp     Ordering User: Debria Fang

## 2023-06-18 NOTE — Telephone Encounter (Signed)
 Patient needs ALL medication called in today . Patient did not know th names of the medicaiton's Please call in to [facility] in [place] today because patient is out of some.

## 2023-08-01 ENCOUNTER — Ambulatory Visit
Admit: 2023-08-01 | Discharge: 2023-08-01 | Payer: BLUE CROSS/BLUE SHIELD | Attending: Cardiovascular Disease | Primary: Family

## 2023-08-01 VITALS — BP 116/68 | HR 62 | Ht 74.0 in | Wt 230.4 lb

## 2023-08-01 DIAGNOSIS — I2510 Atherosclerotic heart disease of native coronary artery without angina pectoris: Principal | ICD-10-CM

## 2023-08-01 MED ORDER — EZETIMIBE 10 MG PO TABS
10 | ORAL_TABLET | Freq: Every day | ORAL | 3 refills | 90.00000 days | Status: DC
Start: 2023-08-01 — End: 2024-01-30

## 2023-08-01 NOTE — Progress Notes (Signed)
 [facility]  [address], [address]  [place], [place] [address]  PHONE: [phone]    Christopher Downs  January 26, 1980      SUBJECTIVE:   Christopher Downs is a 43 y.o. male seen for a follow up visit regarding the following:     Chief Complaint   Patient presents with    Hypertension    Coronary Artery Disease       HPI:    Christopher Downs presents for routine follow. Multiple issues addressed as outlined below:     Coronary Artery Disease  Status:  Patient denies any recent angina.  Notes compliance with medical therapy.  No recent NTG use.    Medications:  Aspirin  81 mg QD  Prior History:  INDEFINITE DAPT DUE TO STENT THROMBOSIS  1.  NSTEMI 12/01/19:  4 x 15 mm DES.   2.  NSTEMI 07/14/20: PCI of RCA using a 4.5 x 26 mm resolute Onyx drug-eluting stent.  Prior stent was underexpanded.  Postdilated to 5.0 mm.  Post interventional IVUS.  All disease in other segments.  EF 45-50% inferior akinesis  3.  Echo (07/14/20):  EF 50-55%.  Inferior HK.  Normal diastolic function.  Mild MR.   4.  Echo (01/23/23):  EF 55-60%.  Normal diastolic function.  No significant valve disease.     Hypertension  Status:  Ambulatory BP readings have been controlled.  Patient reports compliance with medical therapy without side effects.    Medications: Coreg  3.125 mg BID.  Diovan  80 mg QD  Labs: CMP normal in August.    Hyperlipidemia  Status:  At last visit, noted lipids suboptimally controlled.  Lipitor  increased to 80 mg QD and Zetia  10 mg added.  Apparently he never received the prescription for the Zetia .  Medications: Lipitor  80 mg QD  Labs: Needs repeat lipids.       Diabetes Mellitus  Status:  Off Insulin .  Now on Mounjaro.  Has lost weight.  Diabetes controlled             Past Medical History, Past Surgical History, Family history, Social History, and Medications were all reviewed with the patient today and updated as necessary.           Current Outpatient Medications:     Tirzepatide (MOUNJARO) 10 MG/0.5ML SOAJ pen,  Inject 10 mg into the skin every 7 days, Disp: , Rfl:     ezetimibe  (ZETIA ) 10 MG tablet, Take 1 tablet by mouth daily, Disp: 90 tablet, Rfl: 3    atorvastatin  (LIPITOR ) 80 MG tablet, Take 1 tablet by mouth daily, Disp: 90 tablet, Rfl: 3    carvedilol  (COREG ) 3.125 MG tablet, Take 1 tablet by mouth 2 times daily (with meals), Disp: 180 tablet, Rfl: 3    clopidogrel  (PLAVIX ) 75 MG tablet, Take 1 tablet by mouth daily, Disp: 90 tablet, Rfl: 3    valsartan  (DIOVAN ) 80 MG tablet, Take 1 tablet by mouth daily, Disp: 90 tablet, Rfl: 3    nitroGLYCERIN  (NITROSTAT ) 0.4 MG SL tablet, up to max of 3 total doses. If no relief after 1 dose, call 911., Disp: 25 tablet, Rfl: 3    aspirin  81 MG chewable tablet, Take 1 tablet by mouth daily, Disp: , Rfl:      No Known Allergies     Patient Active Problem List    Diagnosis Date Noted    Type 2 diabetes mellitus without complication, with long-term current use of insulin  (HCC) 07/27/2022  Priority: High    Coronary artery disease involving native coronary artery of native heart 07/14/2020     Priority: High     INDEFINITE DAPT DUE TO STENT THROMBOSIS  1.  NSTEMI 12/01/19:  4 x 15 mm DES.   2.  NSTEMI 07/14/20:  PCI of RCA using a 4.5 x 26 mm resolute Onyx drug-eluting stent.  Prior stent was underexpanded.  Postdilated to 5.0 mm.  Post interventional IVUS.  All disease in other segments.  EF 45-50% inferior akinesis  3.  Echo (07/14/20):  EF 50-55%.  Inferior HK.  Normal diastolic function.  Mild MR.   4.  Echo (01/23/23):  EF 55-60%.  Normal diastolic function.  No significant valve disease.       Primary hypertension 07/14/2020     Priority: High    Mixed hyperlipidemia 07/29/2020     Priority: Medium        Social History     Tobacco Use    Smoking status: Never    Smokeless tobacco: Never   Substance Use Topics    Alcohol use: Not on file       ROS:    Review of Systems   Constitutional: Negative for malaise/fatigue.   Cardiovascular:  Negative for chest pain.   Respiratory:   Negative for shortness of breath.    Musculoskeletal:  Positive for arthritis.   Neurological:  Negative for focal weakness.   Psychiatric/Behavioral:  Negative for depression.            PHYSICAL EXAM:  Wt Readings from Last 3 Encounters:   08/01/23 104.5 kg (230 lb 6.4 oz)   01/31/23 114.8 kg (253 lb)   01/23/23 117 kg (258 lb)     BP Readings from Last 3 Encounters:   08/01/23 116/68   01/31/23 120/80   01/23/23 118/85     Pulse Readings from Last 3 Encounters:   08/01/23 62   01/31/23 60   07/27/22 70       Physical Exam  Constitutional:       General: He is not in acute distress.  Neck:      Vascular: No carotid bruit.   Cardiovascular:      Rate and Rhythm: Normal rate and regular rhythm.   Pulmonary:      Effort: No respiratory distress.      Breath sounds: Normal breath sounds.   Abdominal:      General: Bowel sounds are normal.      Palpations: Abdomen is soft.   Musculoskeletal:         General: No swelling.   Skin:     General: Skin is warm and dry.   Neurological:      General: No focal deficit present.   Psychiatric:         Mood and Affect: Mood normal.         Medical problems and test results were reviewed with the patient today.       Lab Results   Component Value Date    WBC 4.0 (L) 04/15/2021    HGB 15.2 04/15/2021    HCT 45.9 04/15/2021    MCV 91.1 04/15/2021    PLT 311 04/15/2021       Lab Results   Component Value Date/Time    NA 138 04/15/2021 11:16 AM    K 4.1 04/15/2021 11:16 AM    CL 108 04/15/2021 11:16 AM    CO2 27 04/15/2021 11:16 AM    BUN  10 04/15/2021 11:16 AM    CREATININE 1.00 04/15/2021 11:16 AM    GLUCOSE 142 04/15/2021 11:16 AM    CALCIUM  9.4 04/15/2021 11:16 AM        Lab Results   Component Value Date    CHOL 128 01/31/2023     Lab Results   Component Value Date    TRIG 73 01/31/2023     Lab Results   Component Value Date    HDL 39 (L) 01/31/2023     No components found for: [facility]  Lab Results   Component Value Date    VLDL 15 01/31/2023     Lab Results    Component Value Date    CHOLHDLRATIO 3.3 01/31/2023        Data from outside records/labs from outside providers have been reviewed and summarized as noted in the HPI, past history and data review sections of this note       ASSESSMENT and PLAN    Christopher Downs was seen today for hypertension and coronary artery disease.    Diagnoses and all orders for this visit:    Coronary artery disease involving native coronary artery of native heart without angina pectoris    Primary hypertension    Mixed hyperlipidemia  -     ezetimibe  (ZETIA ) 10 MG tablet; Take 1 tablet by mouth daily  -     Comprehensive Metabolic Panel; Future  -     Lipid Panel; Future    Type 2 diabetes mellitus without complication, with long-term current use of insulin  (HCC)          Overall Impression    1. Primary hypertension  Blood pressure appears to be well-controlled.  Continue carvedilol  and Diovan  therapy.      2. Coronary artery disease involving native coronary artery of native heart without angina pectoris  Patient is doing well without any anginal symptoms.  Continue Aspirin  81 mg a day, Plavix  75 mg QD and PRN NTG.  We discussed the importance of continued risk factor modification.  The patient is encouraged to contact my office with any worsening dyspnea or chest discomfort.      3. Type 2 diabetes mellitus without complication, with long-term current use of insulin  (HCC)  Glucose appears to be well-controlled on Mounjaro therapy.    4. Mixed hyperlipidemia  Lipids not optimal.  Add Zetia .  Recheck lipids and CMP in 3 months.             Return in about 6 months (around 02/01/2024).       Christopher Cordella Shorten, MD  08/01/2023  9:19 AM    *Portions of the record have been created with voice recognition software. Occasional wrong-word or 'sound-a-like' substitutions may have occurred due to the inherent limitations of voice recognition software. Read the chart carefully and recognize, using context, where substitutions have occurred.

## 2023-11-01 ENCOUNTER — Encounter

## 2023-11-01 LAB — COMPREHENSIVE METABOLIC PANEL
ALT: 52 U/L (ref 8–55)
AST: 38 U/L — ABNORMAL HIGH (ref 15–37)
Albumin/Globulin Ratio: 1.2 (ref 1.0–1.9)
Albumin: 4.2 g/dL (ref 3.5–5.0)
Alk Phosphatase: 73 U/L (ref 40–129)
Anion Gap: 10 mmol/L (ref 7–16)
BUN: 16 mg/dL (ref 6–23)
CO2: 27 mmol/L (ref 20–29)
Calcium: 10 mg/dL (ref 8.8–10.2)
Chloride: 104 mmol/L (ref 98–107)
Creatinine: 1.17 mg/dL (ref 0.80–1.30)
Est, Glom Filt Rate: 80 ml/min/1.73m2 (ref >60)
Globulin: 3.5 g/dL (ref 2.3–3.5)
Glucose: 86 mg/dL (ref 70–99)
Potassium: 4.5 mmol/L (ref 3.5–5.1)
Sodium: 141 mmol/L (ref 136–145)
Total Bilirubin: 0.3 mg/dL (ref 0.0–1.2)
Total Protein: 7.7 g/dL (ref 6.3–8.2)

## 2023-11-01 LAB — LIPID PANEL
Chol/HDL Ratio: 3.8 (ref 0.0–5.0)
Cholesterol, Total: 144 mg/dL (ref 0–200)
HDL: 38 mg/dL — ABNORMAL LOW (ref 40–60)
LDL Cholesterol: 71 mg/dL (ref 0–100)
Triglycerides: 176 mg/dL — ABNORMAL HIGH (ref 0–150)
VLDL Cholesterol Calculated: 35 mg/dL — ABNORMAL HIGH (ref 6–23)

## 2023-11-02 MED ORDER — EVOLOCUMAB 140 MG/ML SC SOAJ
140 | SUBCUTANEOUS | 5 refills | 28.00000 days | Status: AC
Start: 2023-11-02 — End: ?

## 2023-11-02 NOTE — Telephone Encounter (Signed)
"  Patient called with results. Patient voiced understanding. Prescription sent to preferred pharmacy[facility]  Requested Prescriptions     Signed Prescriptions Disp Refills    Evolocumab  (REPATHA ) SOAJ pen 2 Adjustable Dose Pre-filled Pen Syringe 5     Sig: Inject 1 mL into the skin every 14 days     Authorizing Provider: LENWARD DONNICE MATSU     Ordering User: NORLENE AMERICA       "

## 2023-11-02 NOTE — Telephone Encounter (Addendum)
"  Left message with wife for patient to call me back//[facility]    ----- Message from Dr. Donnice Shorten, MD sent at 11/02/2023 10:38 AM EDT -----  Please call patient.  His cholesterol is still not at goal.  LDL was 71.  His goal would be less than 50.  Is he taking Lipitor  80 and Zetia ?  If so we may need to see if he can obtain Repatha  and take this instead of   Zetia .  He would need to be on Repatha  and Lipitor  and recheck lipids in 3 months.       Thank you  ----- Message -----  From: Edi, [facility] [employer]  Sent: 11/01/2023   1:09 PM EDT  To: Donnice KANDICE Shorten, MD    "

## 2023-11-02 NOTE — Telephone Encounter (Signed)
"  Pt returning Brenda's call. Requesting he be called back and not his wife.   "

## 2023-11-02 NOTE — Result Encounter Note (Signed)
"  Please call patient.  His cholesterol is still not at goal.  LDL was 71.  His goal would be less than 50.  Is he taking Lipitor  80 and Zetia ?  If so we may need to see if he can obtain Repatha  and take this instead of Zetia .  He would need to be on Repatha  and Lipitor  and recheck lipids in 3 months.       Thank you  "

## 2024-01-30 ENCOUNTER — Ambulatory Visit
Admit: 2024-01-30 | Discharge: 2024-01-30 | Payer: BLUE CROSS/BLUE SHIELD | Attending: Cardiovascular Disease | Primary: Family

## 2024-01-30 VITALS — BP 108/62 | HR 59 | Ht 74.0 in | Wt 236.6 lb

## 2024-01-30 DIAGNOSIS — I10 Essential (primary) hypertension: Principal | ICD-10-CM

## 2024-01-30 NOTE — Progress Notes (Signed)
 [facility]  [address], [address]  [place], [place] [address]  PHONE: [phone]    [name]  August 16, 1980      SUBJECTIVE:   Christopher Downs is a 44 y.o. male seen for a follow up visit regarding the following:     Chief Complaint   Patient presents with    Hypertension    Coronary Artery Disease       HPI:    Christopher Downs presents for routine follow. Multiple issues addressed as outlined below:     Coronary Artery Disease  Status:  Patient denies any recent angina.  Notes compliance with medical therapy.  No recent NTG use.    Medications:  Aspirin  81 mg QD Plavix  75 mg QD  Prior History:  INDEFINITE DAPT DUE TO STENT THROMBOSIS  1.  NSTEMI 12/01/19:  4 x 15 mm DES.   2.  NSTEMI 07/14/20: PCI of RCA using a 4.5 x 26 mm resolute Onyx drug-eluting stent.  Prior stent was underexpanded.  Postdilated to 5.0 mm.  Post interventional IVUS.  All disease in other segments.  EF 45-50% inferior akinesis  3.  Echo (07/14/20):  EF 50-55%.  Inferior HK.  Normal diastolic function.  Mild MR.   4.  Echo (01/23/23):  EF 55-60%.  Normal diastolic function.  No significant valve disease.     Hypertension  Status:  Ambulatory BP readings have been controlled.  Patient reports compliance with medical therapy without side effects.    Medications: Coreg  3.125 mg BID.  Diovan  80 mg QD  Labs: CMP normal in August.    Hyperlipidemia  Status: Patient started Repatha  since last visit.  LDL has decreased from 71 to 29.  Tolerating therapy.  Medications: Lipitor  80 mg QD.  Labs:    Latest Reference Range & Units 11/01/23 08:13   Cholesterol, Total 0 - 200 MG/DL 855   HDL Cholesterol 40 - 60 MG/DL 38 (L)   LDL Cholesterol 0 - 100 MG/DL 71   Triglycerides 0 - 150 MG/DL 823 (H)   (L): Data is abnormally low  (H): Data is abnormally high  After Repatha   Cholesterol  100 - 199 mg/dL 97 Low     Triglycerides  0 - 149 mg/dL 882    HDL Cholesterol  >39 mg/dL 47    VLDL Cholesterol  5 - 40 mg/dL 21    LDL, Calculated  0 - 99  mg/dL 29          Diabetes Mellitus  Status:  Off Insulin .  Now on Mounjaro.  Has lost weight.  Total of 30 lbs.  Diabetes controlled.  Last HgbA1c 5.9       EKG done today and reviewed with patient showed:  Sinus Bradycardia   -consider old inferior infarct.  Rate 59          Past Medical History, Past Surgical History, Family history, Social History, and Medications were all reviewed with the patient today and updated as necessary.           Current Outpatient Medications:     MOUNJARO 15 MG/0.5ML SOAJ pen, INJECT THE CONTENTS OF ONE PEN UNDER THE SKIN WEEKLY ON THE SAME DAY EACH WEEK, Disp: , Rfl:     JARDIANCE 25 MG tablet, Take 1 tablet by mouth daily, Disp: , Rfl:     Evolocumab  (REPATHA ) SOAJ pen, Inject 1 mL into the skin every 14 days, Disp: 2 Adjustable Dose Pre-filled Pen Syringe, Rfl: 5  atorvastatin  (LIPITOR ) 80 MG tablet, Take 1 tablet by mouth daily, Disp: 90 tablet, Rfl: 3    carvedilol  (COREG ) 3.125 MG tablet, Take 1 tablet by mouth 2 times daily (with meals), Disp: 180 tablet, Rfl: 3    clopidogrel  (PLAVIX ) 75 MG tablet, Take 1 tablet by mouth daily, Disp: 90 tablet, Rfl: 3    valsartan  (DIOVAN ) 80 MG tablet, Take 1 tablet by mouth daily, Disp: 90 tablet, Rfl: 3    nitroGLYCERIN  (NITROSTAT ) 0.4 MG SL tablet, up to max of 3 total doses. If no relief after 1 dose, call 911., Disp: 25 tablet, Rfl: 3    aspirin  81 MG chewable tablet, Take 1 tablet by mouth daily, Disp: , Rfl:      No Known Allergies     Patient Active Problem List    Diagnosis Date Noted    Type 2 diabetes mellitus without complication, with long-term current use of insulin  (HCC) 07/27/2022     Priority: High    Coronary artery disease involving native coronary artery of native heart 07/14/2020     Priority: High     INDEFINITE DAPT DUE TO STENT THROMBOSIS  1.  NSTEMI 12/01/19:  4 x 15 mm DES.   2.  NSTEMI 07/14/20:  PCI of RCA using a 4.5 x 26 mm resolute Onyx drug-eluting stent.  Prior stent was underexpanded.  Postdilated to 5.0  mm.  Post interventional IVUS.  All disease in other segments.  EF 45-50% inferior akinesis  3.  Echo (07/14/20):  EF 50-55%.  Inferior HK.  Normal diastolic function.  Mild MR.   4.  Echo (01/23/23):  EF 55-60%.  Normal diastolic function.  No significant valve disease.       Primary hypertension 07/14/2020     Priority: High    Mixed hyperlipidemia 07/29/2020     Priority: Medium        Social History     Tobacco Use    Smoking status: Never    Smokeless tobacco: Never   Substance Use Topics    Alcohol use: Not on file       ROS:    Review of Systems   Constitutional: Negative for malaise/fatigue.   Cardiovascular:  Negative for chest pain.   Respiratory:  Negative for shortness of breath.    Musculoskeletal:  Negative for arthritis.   Neurological:  Negative for focal weakness.   Psychiatric/Behavioral:  Negative for depression.            PHYSICAL EXAM:  Wt Readings from Last 3 Encounters:   01/30/24 107.3 kg (236 lb 9.6 oz)   08/01/23 104.5 kg (230 lb 6.4 oz)   01/31/23 114.8 kg (253 lb)     BP Readings from Last 3 Encounters:   01/30/24 108/62   08/01/23 116/68   01/31/23 120/80     Pulse Readings from Last 3 Encounters:   01/30/24 59   08/01/23 62   01/31/23 60       Physical Exam  Constitutional:       General: He is not in acute distress.  Neck:      Vascular: No carotid bruit.   Cardiovascular:      Rate and Rhythm: Normal rate and regular rhythm.   Pulmonary:      Effort: No respiratory distress.      Breath sounds: Normal breath sounds.   Abdominal:      General: Bowel sounds are normal.      Palpations: Abdomen is soft.  Musculoskeletal:         General: No swelling.   Skin:     General: Skin is warm and dry.   Neurological:      General: No focal deficit present.   Psychiatric:         Mood and Affect: Mood normal.         Medical problems and test results were reviewed with the patient today.       Lab Results   Component Value Date    WBC 4.0 (L) 04/15/2021    HGB 15.2 04/15/2021    HCT 45.9 04/15/2021     MCV 91.1 04/15/2021    PLT 311 04/15/2021       Lab Results   Component Value Date/Time    NA 141 11/01/2023 08:13 AM    K 4.5 11/01/2023 08:13 AM    CL 104 11/01/2023 08:13 AM    CO2 27 11/01/2023 08:13 AM    BUN 16 11/01/2023 08:13 AM    CREATININE 1.17 11/01/2023 08:13 AM    GLUCOSE 86 11/01/2023 08:13 AM    CALCIUM  10.0 11/01/2023 08:13 AM        Lab Results   Component Value Date    CHOL 144 11/01/2023     Lab Results   Component Value Date    TRIG 176 (H) 11/01/2023     Lab Results   Component Value Date    HDL 38 (L) 11/01/2023     No components found for: LDLCHOLESTEROL, [facility]  Lab Results   Component Value Date    VLDL 35 (H) 11/01/2023     Lab Results   Component Value Date    CHOLHDLRATIO 3.8 11/01/2023        Data from outside records/labs from outside providers have been reviewed and summarized as noted in the HPI, past history and data review sections of this note       ASSESSMENT and PLAN    Demani was seen today for hypertension and coronary artery disease.    Diagnoses and all orders for this visit:    Primary hypertension  -     EKG 12 Lead    Coronary artery disease involving native coronary artery of native heart without angina pectoris    Mixed hyperlipidemia    Type 2 diabetes mellitus without complication, with long-term current use of insulin  (HCC)          Overall Impression    1. Primary hypertension  Blood pressure appears to be well-controlled.  Continue carvedilol  and Diovan  therapy.      2. Coronary artery disease involving native coronary artery of native heart without angina pectoris  Patient is doing well without any anginal symptoms.  Continue Aspirin  81 mg a day, Plavix  75 mg QD and PRN NTG.  We discussed the importance of continued risk factor modification.  The patient is encouraged to contact my office with any worsening dyspnea or chest discomfort.      3. Type 2 diabetes mellitus without complication, with long-term current use of insulin  (HCC)  Glucose appears to be  well-controlled on Mounjaro therapy.    4. Mixed hyperlipidemia  Lipids are at goal.  Continue Repatha  and Lipitor .    Will continue current medical regimen and monitor for any side effects or complications of treatment. Discussed importance of healthy diet.               Return in about 6 months (around 07/29/2024).  Donnice Cordella Shorten, MD  01/30/2024  9:19 AM    *Portions of the record have been created with voice recognition software. Occasional wrong-word or 'sound-a-like' substitutions may have occurred due to the inherent limitations of voice recognition software. Read the chart carefully and recognize, using context, where substitutions have occurred.

## 2024-02-14 ENCOUNTER — Encounter

## 2024-02-15 MED ORDER — VALSARTAN 80 MG PO TABS
80 | ORAL_TABLET | Freq: Every day | ORAL | 3 refills | 90.00000 days | Status: AC
Start: 2024-02-15 — End: ?

## 2024-02-15 NOTE — Telephone Encounter (Signed)
 Prescription sent to [email or web address]  Requested Prescriptions     Signed Prescriptions Disp Refills    valsartan  (DIOVAN ) 80 MG tablet 90 tablet 3     Sig: TAKE ONE TABLET BY MOUTH ONE TIME DAILY     Authorizing Provider: LENWARD DONNICE MATSU     Ordering User: NORLENE AMERICA
